# Patient Record
Sex: Female | Born: 1985 | Race: White | Hispanic: No | Marital: Married | Smoking: Current every day smoker
Health system: Southern US, Community
[De-identification: ages and names within clinical notes are randomized; demographics above are authoritative.]

## PROBLEM LIST (undated history)

## (undated) DIAGNOSIS — C73 Malignant neoplasm of thyroid gland: Secondary | ICD-10-CM

## (undated) DIAGNOSIS — J45909 Unspecified asthma, uncomplicated: Secondary | ICD-10-CM

## (undated) DIAGNOSIS — F419 Anxiety disorder, unspecified: Secondary | ICD-10-CM

## (undated) DIAGNOSIS — F32A Depression, unspecified: Secondary | ICD-10-CM

## (undated) HISTORY — DX: Anxiety disorder, unspecified: F41.9

## (undated) HISTORY — PX: THYROIDECTOMY: SHX17

## (undated) HISTORY — DX: Unspecified asthma, uncomplicated: J45.909

## (undated) HISTORY — DX: Depression, unspecified: F32.A

## (undated) HISTORY — PX: LAPAROSCOPIC HYSTERECTOMY: SHX1926

## (undated) HISTORY — DX: Malignant neoplasm of thyroid gland: C73

---

## 2003-01-16 ENCOUNTER — Emergency Department (HOSPITAL_COMMUNITY): Admission: EM | Admit: 2003-01-16 | Discharge: 2003-01-16 | Payer: Self-pay | Admitting: Emergency Medicine

## 2003-01-16 ENCOUNTER — Encounter: Payer: Self-pay | Admitting: Emergency Medicine

## 2003-05-18 ENCOUNTER — Emergency Department (HOSPITAL_COMMUNITY): Admission: EM | Admit: 2003-05-18 | Discharge: 2003-05-18 | Payer: Self-pay | Admitting: Emergency Medicine

## 2004-02-11 ENCOUNTER — Emergency Department (HOSPITAL_COMMUNITY): Admission: EM | Admit: 2004-02-11 | Discharge: 2004-02-12 | Payer: Self-pay | Admitting: Emergency Medicine

## 2004-04-11 ENCOUNTER — Emergency Department (HOSPITAL_COMMUNITY): Admission: EM | Admit: 2004-04-11 | Discharge: 2004-04-11 | Payer: Self-pay | Admitting: Emergency Medicine

## 2004-04-24 ENCOUNTER — Emergency Department (HOSPITAL_COMMUNITY): Admission: EM | Admit: 2004-04-24 | Discharge: 2004-04-24 | Payer: Self-pay | Admitting: Emergency Medicine

## 2005-06-30 IMAGING — CT CT PELVIS W/O CM
1 of 3 series · 15 of 32 positions shown, 19 images · non-contrast
Comparison: none

CLINICAL DATA: Acute left flank pain. 
 CT ABDOMEN WITHOUT CONTRAST AND CT PELVIS WITHOUT CONTRAST 
 No prior studies for comparison.  No IV or oral contrast was administered. 
 CT ABDOMEN WITHOUT CONTRAST
 Kidneys are normal in appearance without evidence of calculi or obstruction.  The other unenhanced organs are unremarkable.  There is a small accessory spleen located near the hilum of the spleen.  No bowel obstruction. 
 IMPRESSION
 No evidence of renal calculi or obstruction. 
 CT PELVIS WITHOUT CONTRAST 
 There is some air in the nondependent bladder.  No abnormal calcifications in the bladder or distal ureters.  Within the uterus some lower density material is present measuring 3.7 x 3.4 cm.  It is unclear what this represents.  Some radiopaque pill fragments are present in the small bowel.  No free fluid. 
 Air in the bladder.  Low density collection in the uterus.

[Series 4: — · axial · 0.58mm/px · z∈[+867,+1155]mm · 15 of 83 slices shown, 19 images]
[im 7/83  soft-tissue]
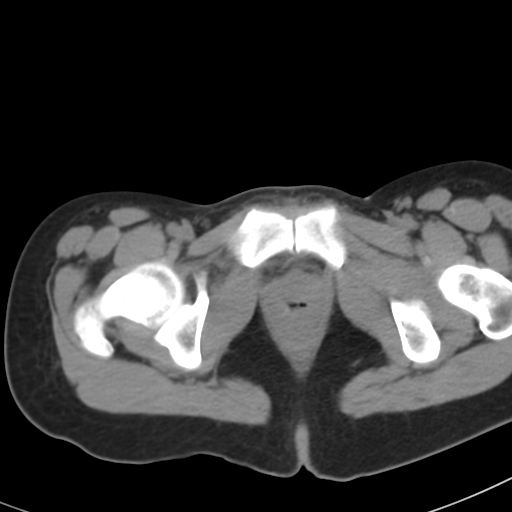
[im 7/83  bone]
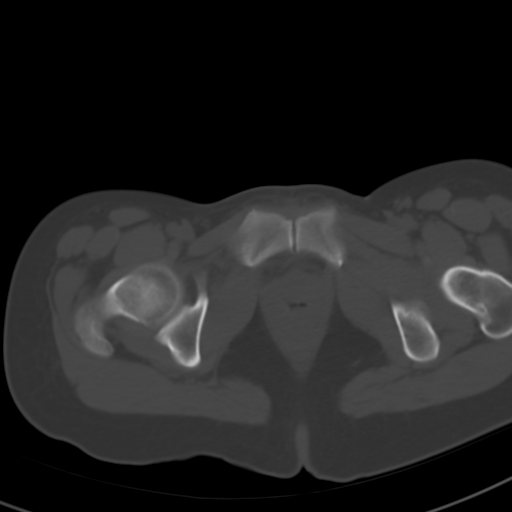
[im 13/83  soft-tissue]
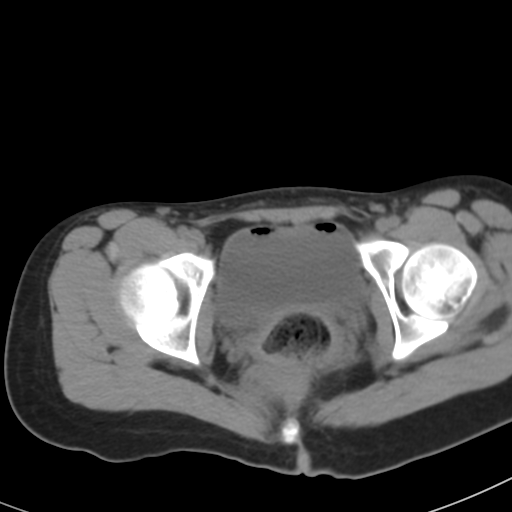
[im 19/83  soft-tissue]
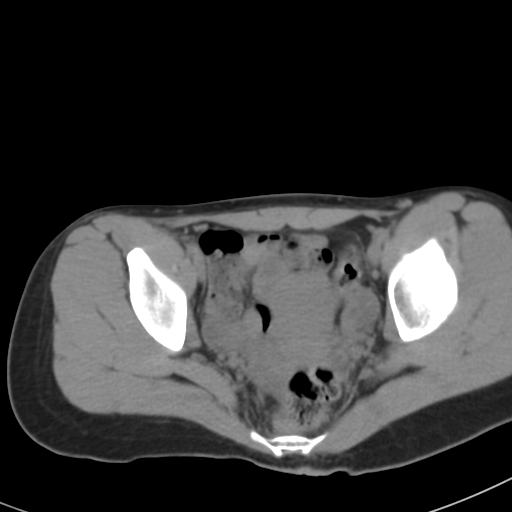
[im 25/83  soft-tissue]
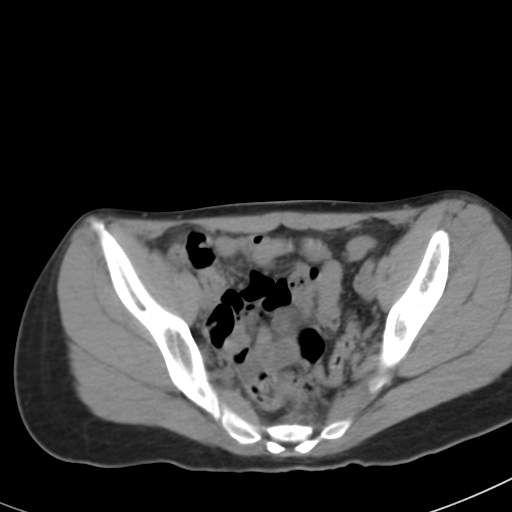
[im 31/83  soft-tissue]
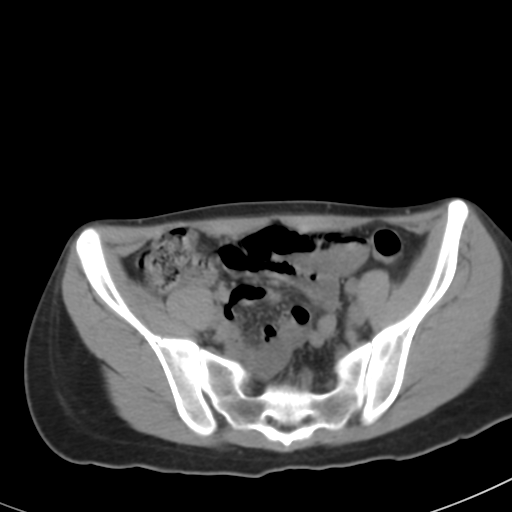
[im 37/83  soft-tissue]
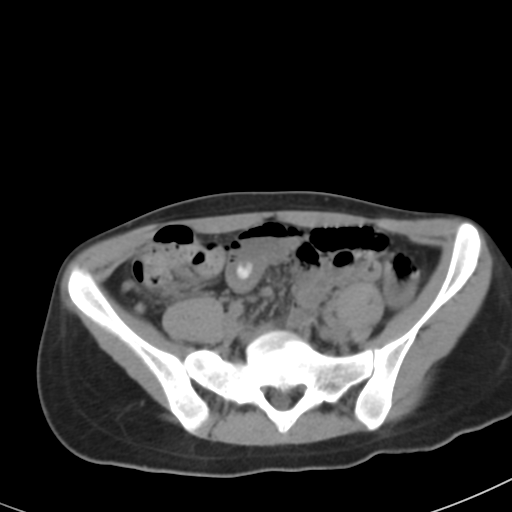
[im 43/83  soft-tissue]
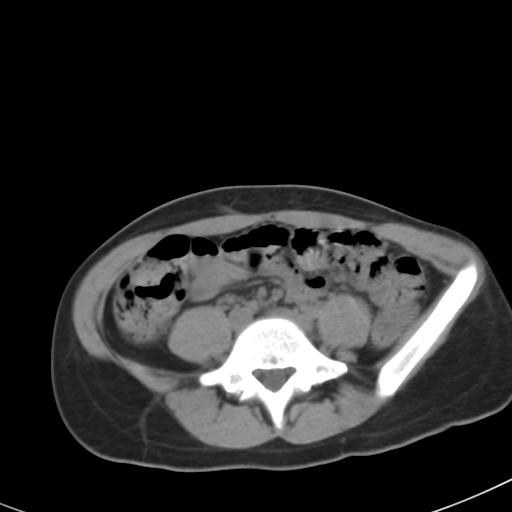
[im 49/83  soft-tissue]
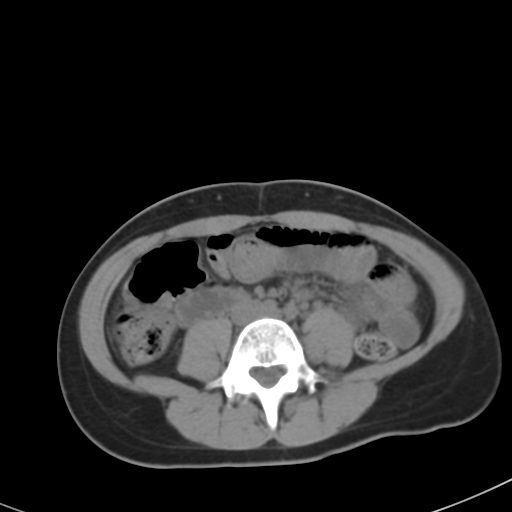
[im 55/83  soft-tissue]
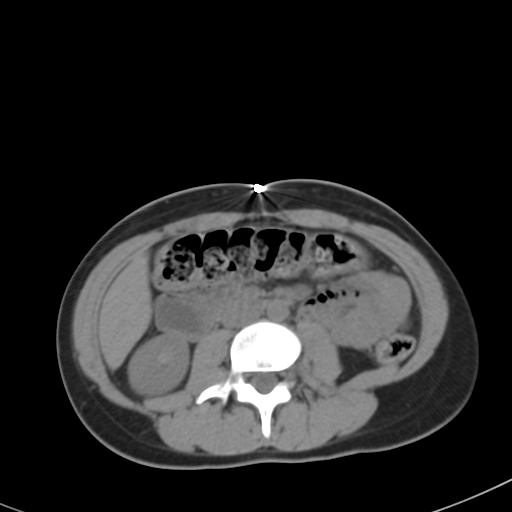
[im 55/83  bone]
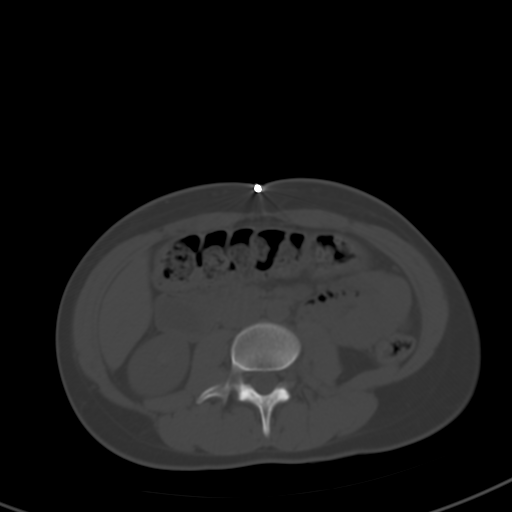
[im 61/83  soft-tissue]
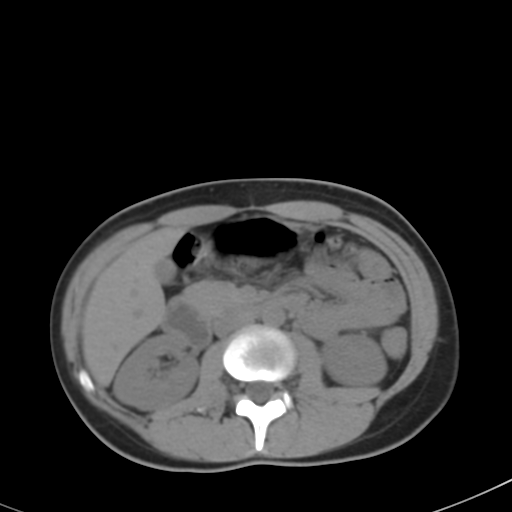
[im 67/83  soft-tissue]
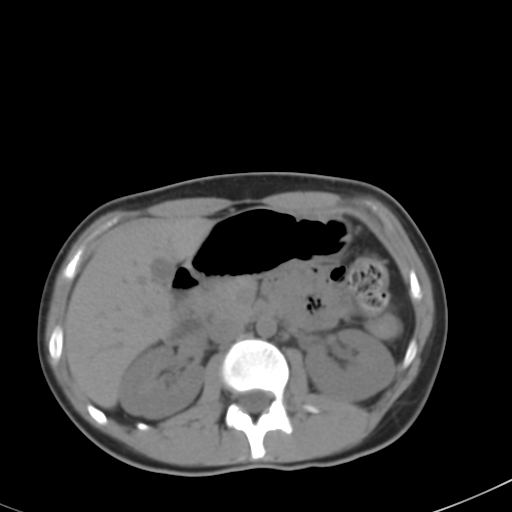
[im 70/83  lung]
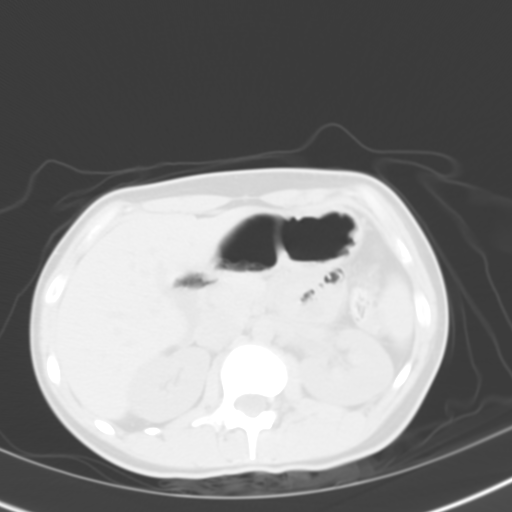
[im 73/83  soft-tissue]
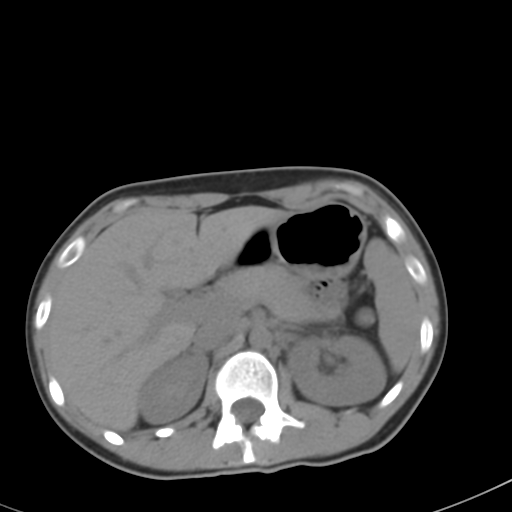
[im 73/83  lung]
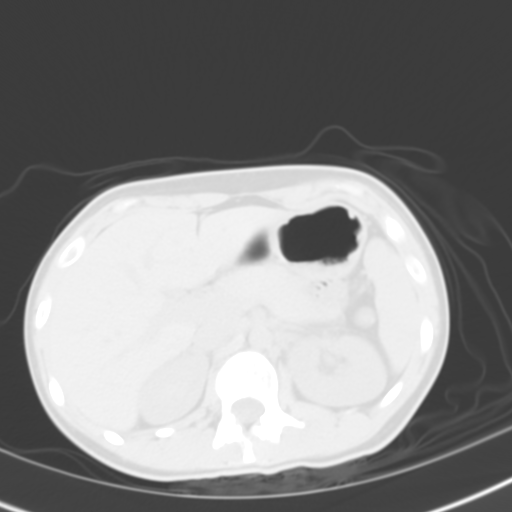
[im 76/83  lung]
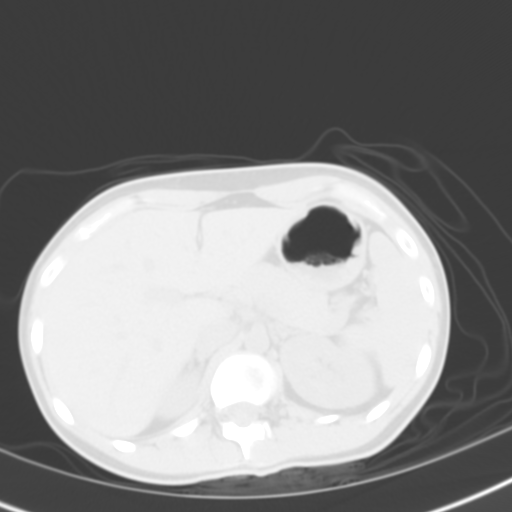
[im 79/83  soft-tissue]
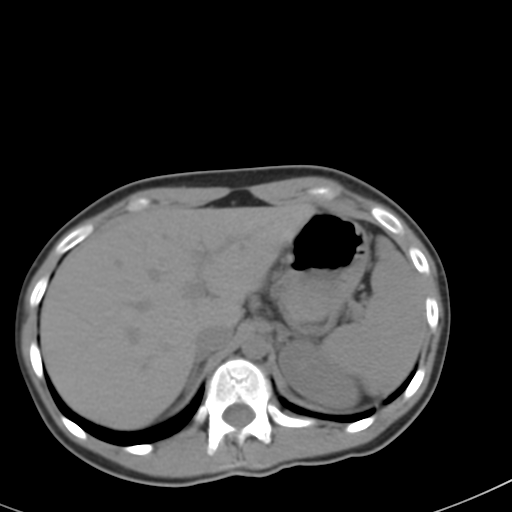
[im 79/83  lung]
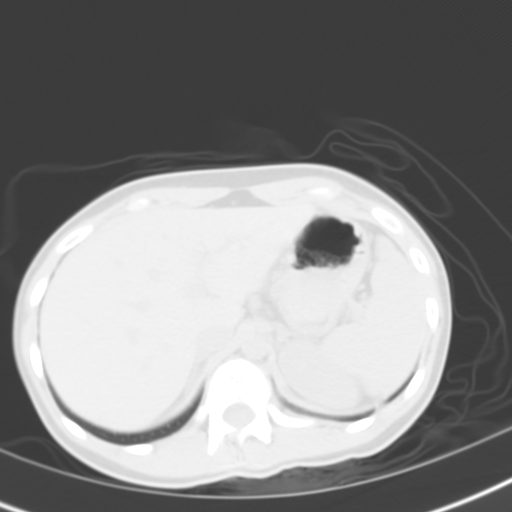

[15 of 32 positions shown; findings below may reference images not displayed]

## 2005-09-10 IMAGING — CT CT PELVIS W/O CM
1 series · 16 of 32 positions shown, 20 images · non-contrast
Comparison: none

CLINICAL DATA: Bilateral flank pain.  Dysuria.  
 CT SCAN OF THE ABDOMEN AND PELVIS WITHOUT CONTRAST, 04/24/04
 Scans were performed using the renal stone protocol.  
 CT ABDOMEN 
 The visualized portions of the liver and spleen appear normal.  Adrenal glands, pancreas, and kidneys appear normal.  There is a small splenule which is an anatomic variant.  No dilated loops of large or small bowel.  No adenopathy. 
 IMPRESSION
 Negative CT scan of the abdomen. 
 CT PELVIS
 The retrocecal appendix is well visualized and appears normal.  No dilated loops of large or small bowel.  The uterus and ovaries are not enlarged and there is no free fluid.  No distal ureteral dilatation or renal or ureteral calculi. 
 Negative CT scan of the pelvis.

[Series 3: — · axial · 0.55mm/px · z∈[-526,-162]mm · 16 of 102 slices shown, 20 images]
[im 7/102  soft-tissue]
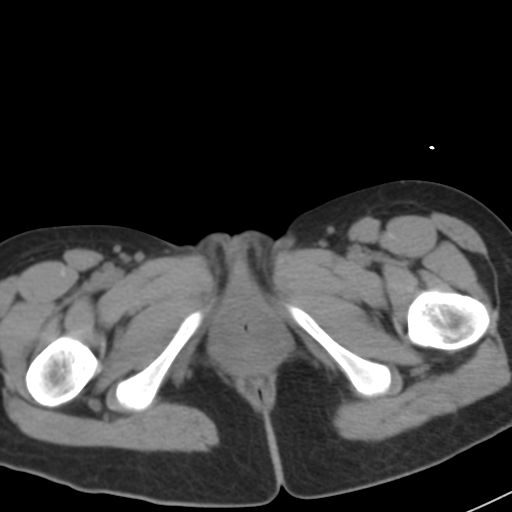
[im 7/102  bone]
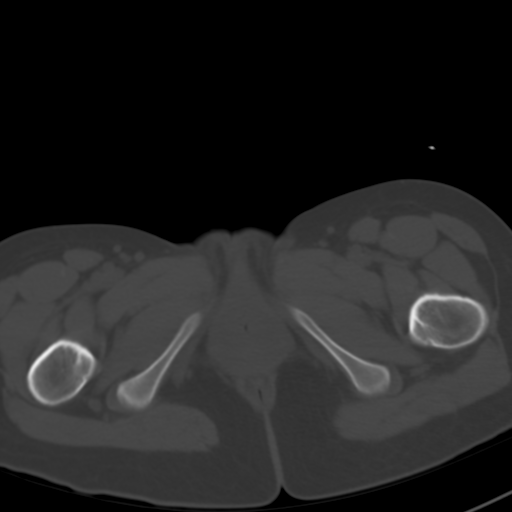
[im 14/102  soft-tissue]
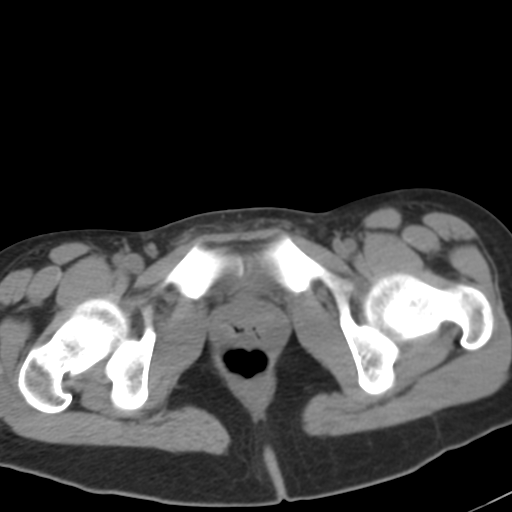
[im 20/102  soft-tissue]
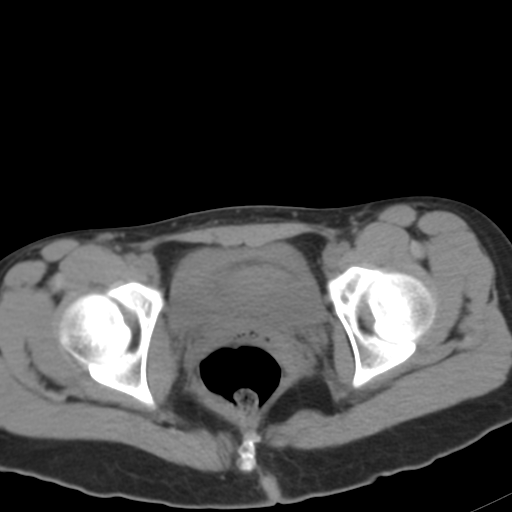
[im 27/102  soft-tissue]
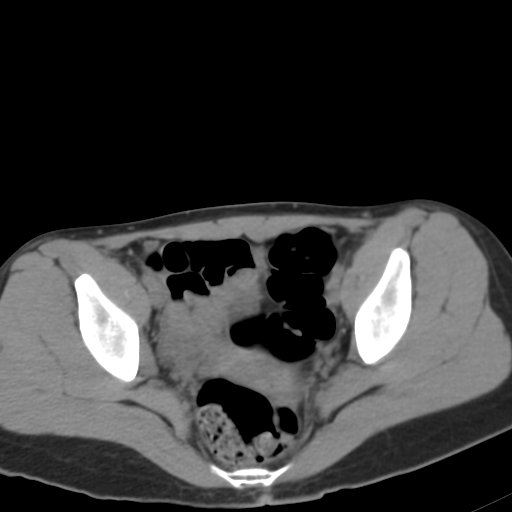
[im 33/102  soft-tissue]
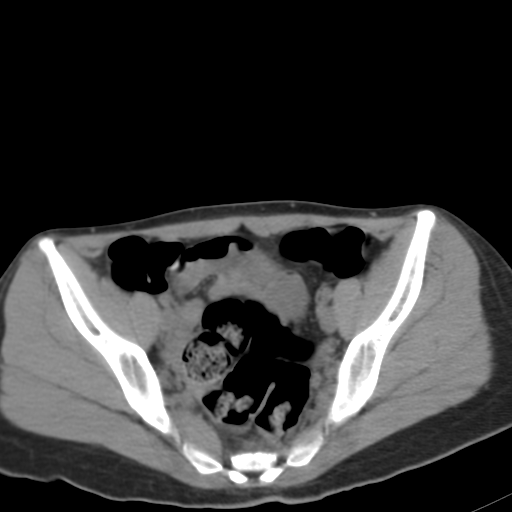
[im 40/102  soft-tissue]
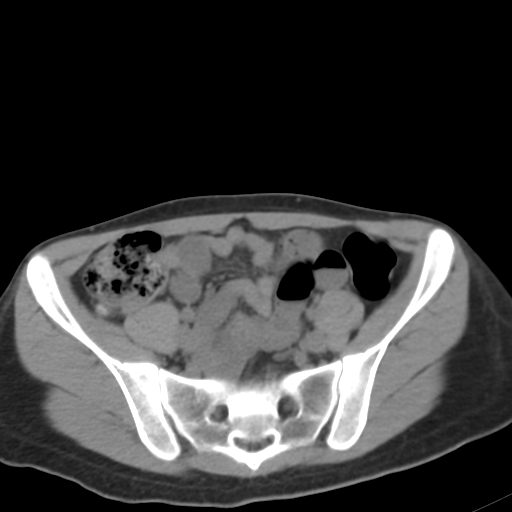
[im 46/102  soft-tissue]
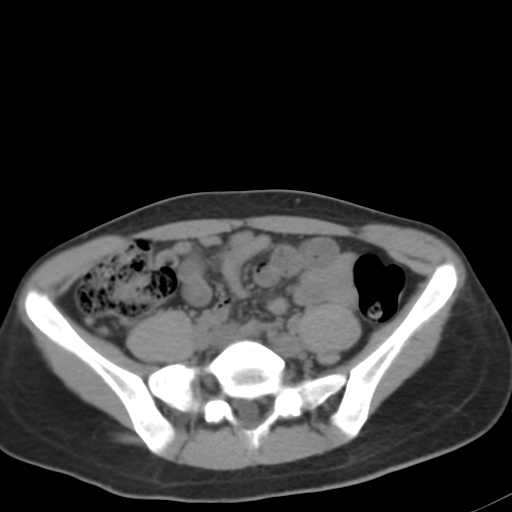
[im 56/102  soft-tissue]
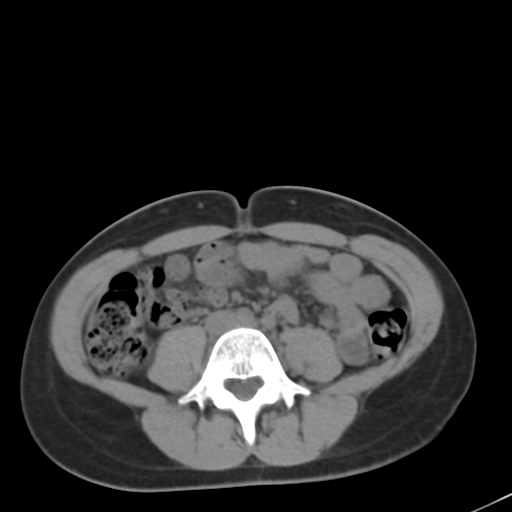
[im 62/102  soft-tissue]
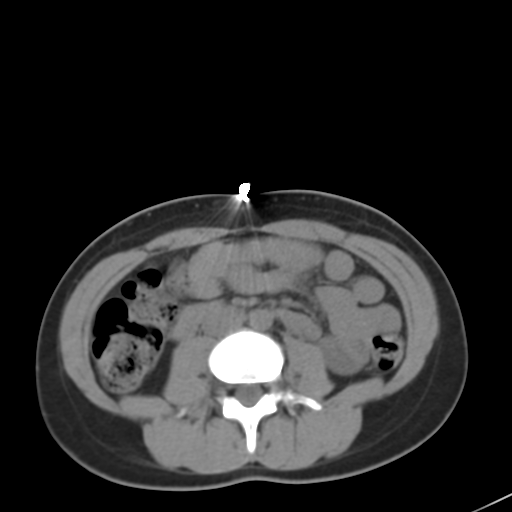
[im 62/102  bone]
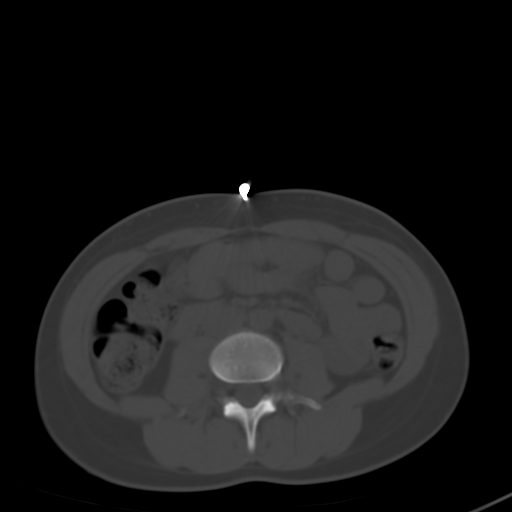
[im 69/102  soft-tissue]
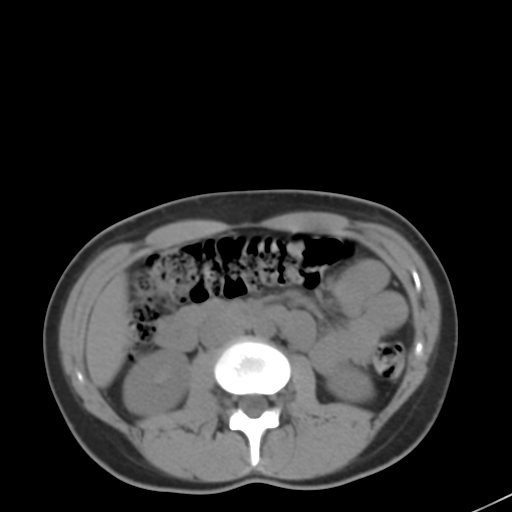
[im 75/102  soft-tissue]
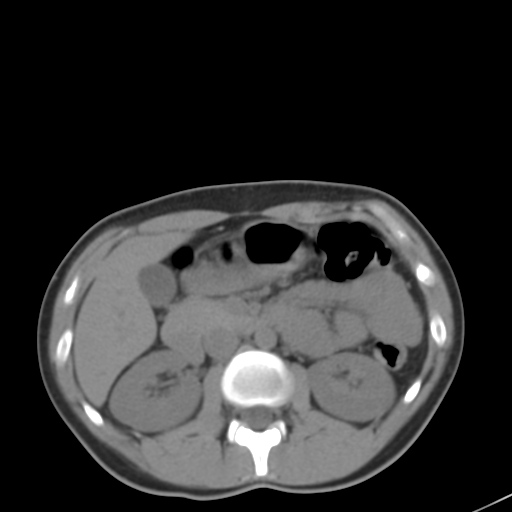
[im 82/102  soft-tissue]
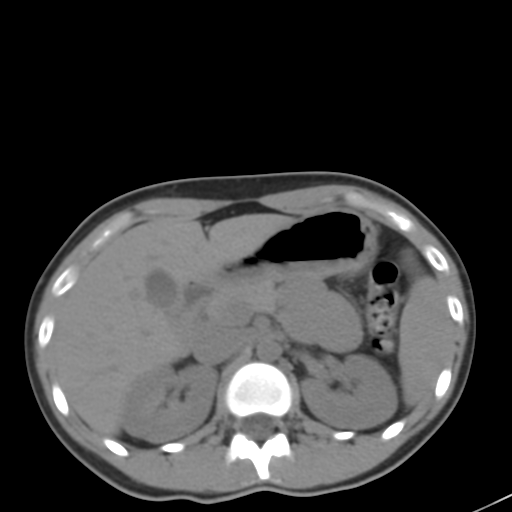
[im 88/102  soft-tissue]
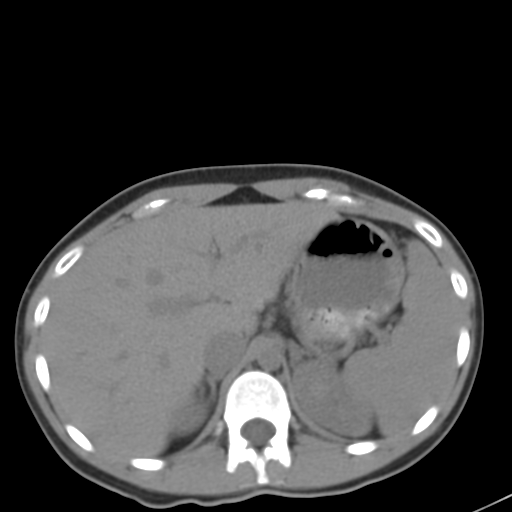
[im 88/102  lung]
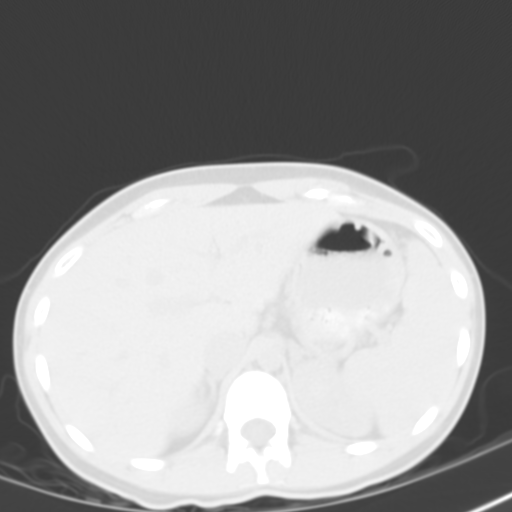
[im 92/102  lung]
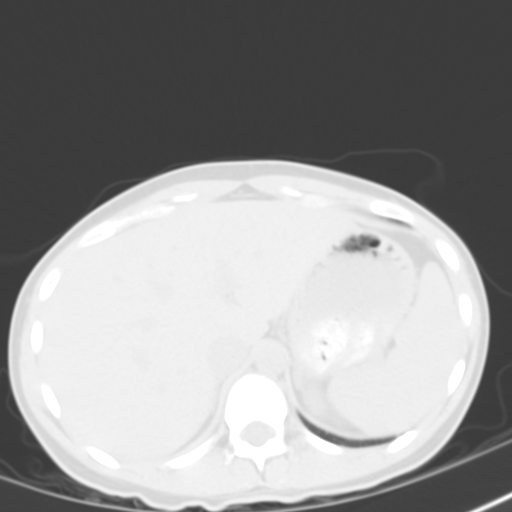
[im 95/102  soft-tissue]
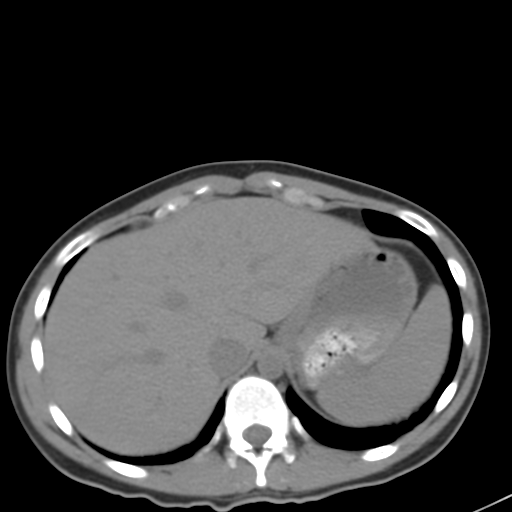
[im 95/102  lung]
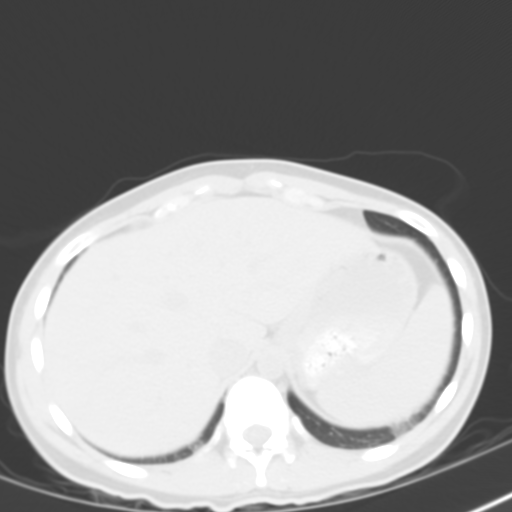
[im 98/102  lung]
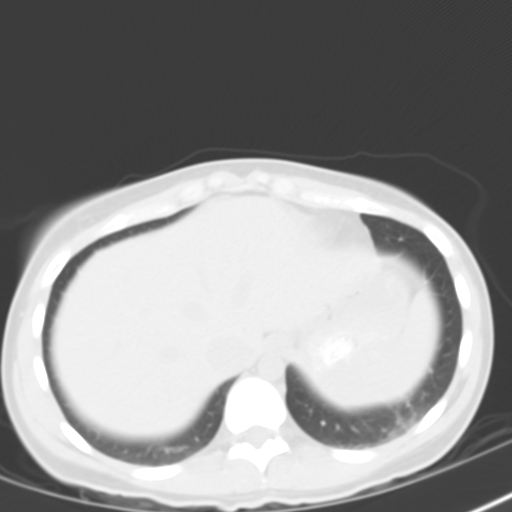

[16 of 32 positions shown; findings below may reference images not displayed]

## 2010-04-10 ENCOUNTER — Ambulatory Visit (HOSPITAL_COMMUNITY): Admission: RE | Admit: 2010-04-10 | Discharge: 2010-04-11 | Payer: Self-pay | Admitting: Obstetrics and Gynecology

## 2010-04-10 ENCOUNTER — Encounter (INDEPENDENT_AMBULATORY_CARE_PROVIDER_SITE_OTHER): Payer: Self-pay | Admitting: Obstetrics and Gynecology

## 2010-11-01 LAB — COMPREHENSIVE METABOLIC PANEL WITH GFR
ALT: 8 U/L (ref 0–35)
AST: 14 U/L (ref 0–37)
Albumin: 4.5 g/dL (ref 3.5–5.2)
Alkaline Phosphatase: 65 U/L (ref 39–117)
BUN: 18 mg/dL (ref 6–23)
CO2: 24 meq/L (ref 19–32)
Calcium: 9.5 mg/dL (ref 8.4–10.5)
Chloride: 104 meq/L (ref 96–112)
Creatinine, Ser: 0.68 mg/dL (ref 0.4–1.2)
GFR calc non Af Amer: >60 mL/min
Glucose, Bld: 110 mg/dL — ABNORMAL HIGH (ref 70–99)
Potassium: 3.3 meq/L — ABNORMAL LOW (ref 3.5–5.1)
Sodium: 137 meq/L (ref 135–145)
Total Bilirubin: 0.5 mg/dL (ref 0.3–1.2)
Total Protein: 7.5 g/dL (ref 6.0–8.3)

## 2010-11-01 LAB — CBC
HCT: 32.3 % — ABNORMAL LOW (ref 36.0–46.0)
HCT: 40.9 % (ref 36.0–46.0)
Hemoglobin: 11.1 g/dL — ABNORMAL LOW (ref 12.0–15.0)
MCH: 31.4 pg (ref 26.0–34.0)
MCHC: 34.2 g/dL (ref 30.0–36.0)
MCHC: 34.6 g/dL (ref 30.0–36.0)
MCV: 89.7 fL (ref 78.0–100.0)
MCV: 91.8 fL (ref 78.0–100.0)
Platelets: 237 10*3/uL (ref 150–400)
RBC: 3.52 MIL/uL — ABNORMAL LOW (ref 3.87–5.11)
RDW: 12.7 % (ref 11.5–15.5)
RDW: 12.8 % (ref 11.5–15.5)
WBC: 14.7 10*3/uL — ABNORMAL HIGH (ref 4.0–10.5)
WBC: 17.9 10*3/uL — ABNORMAL HIGH (ref 4.0–10.5)

## 2010-11-01 LAB — SURGICAL PCR SCREEN
MRSA, PCR: NEGATIVE
Staphylococcus aureus: POSITIVE — AB

## 2010-11-01 LAB — HEMOGLOBIN AND HEMATOCRIT, BLOOD
HCT: 34.4 % — ABNORMAL LOW (ref 36.0–46.0)
Hemoglobin: 12.2 g/dL (ref 12.0–15.0)

## 2010-11-01 LAB — APTT: aPTT: 33 seconds (ref 24–37)

## 2010-11-01 LAB — URINALYSIS, ROUTINE W REFLEX MICROSCOPIC
Bilirubin Urine: NEGATIVE
Glucose, UA: NEGATIVE mg/dL
Specific Gravity, Urine: 1.025 (ref 1.005–1.030)
pH: 6 (ref 5.0–8.0)

## 2010-11-01 LAB — URINE MICROSCOPIC-ADD ON

## 2013-05-17 ENCOUNTER — Other Ambulatory Visit (HOSPITAL_COMMUNITY): Payer: Self-pay | Admitting: Internal Medicine

## 2013-05-17 DIAGNOSIS — C73 Malignant neoplasm of thyroid gland: Secondary | ICD-10-CM

## 2013-05-25 ENCOUNTER — Encounter (HOSPITAL_COMMUNITY)
Admission: RE | Admit: 2013-05-25 | Discharge: 2013-05-25 | Disposition: A | Discharge status: Home / Self Care | Payer: Medicaid Other | Source: Ambulatory Visit | Attending: Internal Medicine | Admitting: Internal Medicine

## 2013-05-25 DIAGNOSIS — C73 Malignant neoplasm of thyroid gland: Secondary | ICD-10-CM | POA: Insufficient documentation

## 2013-05-25 LAB — HCG, SERUM, QUALITATIVE
Preg, Serum: POSITIVE — AB
Preg, Serum: POSITIVE — AB

## 2013-07-26 ENCOUNTER — Other Ambulatory Visit (HOSPITAL_COMMUNITY): Payer: Self-pay | Admitting: Internal Medicine

## 2013-07-26 DIAGNOSIS — C73 Malignant neoplasm of thyroid gland: Secondary | ICD-10-CM

## 2013-08-01 ENCOUNTER — Encounter (HOSPITAL_COMMUNITY)
Admission: RE | Admit: 2013-08-01 | Discharge: 2013-08-01 | Disposition: A | Discharge status: Home / Self Care | Payer: Medicaid Other | Source: Ambulatory Visit | Attending: Internal Medicine | Admitting: Internal Medicine

## 2013-08-01 DIAGNOSIS — C73 Malignant neoplasm of thyroid gland: Secondary | ICD-10-CM | POA: Insufficient documentation

## 2013-08-01 MED ORDER — THYROTROPIN ALFA 1.1 MG IM SOLR
0.9000 mg | INTRAMUSCULAR | Status: AC
  Administered 2013-08-01: 0.9 mg via INTRAMUSCULAR

## 2013-08-02 ENCOUNTER — Encounter (HOSPITAL_COMMUNITY)
Admission: RE | Admit: 2013-08-02 | Discharge: 2013-08-02 | Disposition: A | Discharge status: Home / Self Care | Payer: Medicaid Other | Source: Ambulatory Visit | Attending: Internal Medicine | Admitting: Internal Medicine

## 2013-08-02 DIAGNOSIS — C73 Malignant neoplasm of thyroid gland: Secondary | ICD-10-CM | POA: Insufficient documentation

## 2013-08-02 MED ORDER — THYROTROPIN ALFA 1.1 MG IM SOLR
0.9000 mg | INTRAMUSCULAR | Status: AC
  Administered 2013-08-02: 0.9 mg via INTRAMUSCULAR

## 2013-08-03 ENCOUNTER — Encounter (HOSPITAL_COMMUNITY)
Admission: RE | Admit: 2013-08-03 | Discharge: 2013-08-03 | Disposition: A | Discharge status: Home / Self Care | Payer: Medicaid Other | Source: Ambulatory Visit | Attending: Internal Medicine | Admitting: Internal Medicine

## 2013-08-03 DIAGNOSIS — C73 Malignant neoplasm of thyroid gland: Secondary | ICD-10-CM | POA: Insufficient documentation

## 2013-08-03 MED ORDER — SODIUM IODIDE I 131 CAPSULE
29.6000 | Freq: Once | INTRAVENOUS | Status: AC | PRN
  Administered 2013-08-03: 29.6 via ORAL

## 2013-08-12 ENCOUNTER — Other Ambulatory Visit (HOSPITAL_COMMUNITY): Payer: Self-pay | Admitting: Internal Medicine

## 2013-08-12 ENCOUNTER — Encounter (HOSPITAL_COMMUNITY): Payer: Medicaid Other

## 2013-08-12 ENCOUNTER — Ambulatory Visit (HOSPITAL_COMMUNITY)
Admission: RE | Admit: 2013-08-12 | Discharge: 2013-08-12 | Disposition: A | Discharge status: Home / Self Care | Payer: Medicaid Other | Source: Ambulatory Visit | Attending: Internal Medicine | Admitting: Internal Medicine

## 2013-08-12 DIAGNOSIS — C73 Malignant neoplasm of thyroid gland: Secondary | ICD-10-CM

## 2013-08-12 DIAGNOSIS — Z9889 Other specified postprocedural states: Secondary | ICD-10-CM | POA: Insufficient documentation

## 2014-06-28 ENCOUNTER — Other Ambulatory Visit (HOSPITAL_COMMUNITY): Payer: Self-pay | Admitting: Internal Medicine

## 2014-06-28 DIAGNOSIS — C73 Malignant neoplasm of thyroid gland: Secondary | ICD-10-CM

## 2014-07-17 ENCOUNTER — Encounter (HOSPITAL_COMMUNITY)
Admission: RE | Admit: 2014-07-17 | Discharge: 2014-07-17 | Disposition: A | Discharge status: Home / Self Care | Payer: Medicaid Other | Source: Ambulatory Visit | Attending: Internal Medicine | Admitting: Internal Medicine

## 2014-07-17 DIAGNOSIS — C73 Malignant neoplasm of thyroid gland: Secondary | ICD-10-CM

## 2014-07-17 MED ORDER — THYROTROPIN ALFA 1.1 MG IM SOLR
0.9000 mg | INTRAMUSCULAR | Status: AC
  Administered 2014-07-17: 0.9 mg via INTRAMUSCULAR

## 2014-07-18 ENCOUNTER — Encounter (HOSPITAL_COMMUNITY)
Admission: RE | Admit: 2014-07-18 | Discharge: 2014-07-18 | Disposition: A | Discharge status: Home / Self Care | Payer: Medicaid Other | Source: Ambulatory Visit | Attending: Internal Medicine | Admitting: Internal Medicine

## 2014-07-18 DIAGNOSIS — C73 Malignant neoplasm of thyroid gland: Secondary | ICD-10-CM | POA: Diagnosis not present

## 2014-07-18 MED ORDER — THYROTROPIN ALFA 1.1 MG IM SOLR
0.9000 mg | INTRAMUSCULAR | Status: AC
  Administered 2014-07-18: 0.9 mg via INTRAMUSCULAR

## 2014-07-19 ENCOUNTER — Encounter (HOSPITAL_COMMUNITY)
Admission: RE | Admit: 2014-07-19 | Discharge: 2014-07-19 | Disposition: A | Discharge status: Home / Self Care | Payer: Medicaid Other | Source: Ambulatory Visit | Attending: Internal Medicine | Admitting: Internal Medicine

## 2014-07-19 MED ORDER — SODIUM IODIDE I 131 CAPSULE: 4.0000 | Freq: Once | INTRAVENOUS | Status: AC | PRN

## 2014-07-21 ENCOUNTER — Encounter (HOSPITAL_COMMUNITY)
Admission: RE | Admit: 2014-07-21 | Discharge: 2014-07-21 | Disposition: A | Discharge status: Home / Self Care | Payer: Medicaid Other | Source: Ambulatory Visit | Attending: Internal Medicine | Admitting: Internal Medicine

## 2014-07-21 DIAGNOSIS — C73 Malignant neoplasm of thyroid gland: Secondary | ICD-10-CM | POA: Diagnosis not present

## 2014-07-22 LAB — THYROGLOBULIN ANTIBODY

## 2014-07-22 LAB — THYROGLOBULIN LEVEL: THYROGLOBULIN: 0.1 ng/mL — AB (ref 2.8–40.9)

## 2021-03-19 ENCOUNTER — Emergency Department (HOSPITAL_COMMUNITY)
Admission: EM | Admit: 2021-03-19 | Discharge: 2021-03-19 | Disposition: A | Discharge status: Home / Self Care | Payer: Medicaid Other | Attending: Emergency Medicine | Admitting: Emergency Medicine

## 2021-03-19 ENCOUNTER — Emergency Department (HOSPITAL_COMMUNITY): Payer: Medicaid Other

## 2021-03-19 DIAGNOSIS — K529 Noninfective gastroenteritis and colitis, unspecified: Secondary | ICD-10-CM | POA: Diagnosis not present

## 2021-03-19 DIAGNOSIS — R1032 Left lower quadrant pain: Secondary | ICD-10-CM | POA: Diagnosis present

## 2021-03-19 LAB — URINALYSIS, ROUTINE W REFLEX MICROSCOPIC
Bilirubin Urine: NEGATIVE
Glucose, UA: NEGATIVE mg/dL
Ketones, ur: NEGATIVE mg/dL
Leukocytes,Ua: NEGATIVE
Nitrite: NEGATIVE
Protein, ur: NEGATIVE mg/dL
Specific Gravity, Urine: 1.021 (ref 1.005–1.030)
pH: 5 (ref 5.0–8.0)

## 2021-03-19 LAB — CBC WITH DIFFERENTIAL/PLATELET
Abs Immature Granulocytes: 0.04 10*3/uL (ref 0.00–0.07)
Basophils Absolute: 0 10*3/uL (ref 0.0–0.1)
Basophils Relative: 0 %
Eosinophils Absolute: 0.1 10*3/uL (ref 0.0–0.5)
Eosinophils Relative: 1 %
HCT: 39.2 % (ref 36.0–46.0)
Hemoglobin: 13.6 g/dL (ref 12.0–15.0)
Immature Granulocytes: 0 %
Lymphocytes Relative: 24 %
Lymphs Abs: 2.8 10*3/uL (ref 0.7–4.0)
MCH: 31.3 pg (ref 26.0–34.0)
MCHC: 34.7 g/dL (ref 30.0–36.0)
MCV: 90.3 fL (ref 80.0–100.0)
Monocytes Absolute: 0.6 10*3/uL (ref 0.1–1.0)
Monocytes Relative: 5 %
Neutro Abs: 7.9 10*3/uL — ABNORMAL HIGH (ref 1.7–7.7)
Neutrophils Relative %: 70 %
Platelets: 313 10*3/uL (ref 150–400)
RBC: 4.34 MIL/uL (ref 3.87–5.11)
RDW: 12.1 % (ref 11.5–15.5)
WBC: 11.4 10*3/uL — ABNORMAL HIGH (ref 4.0–10.5)
nRBC: 0 % (ref 0.0–0.2)

## 2021-03-19 LAB — COMPREHENSIVE METABOLIC PANEL
ALT: 16 U/L (ref 0–44)
AST: 18 U/L (ref 15–41)
Albumin: 4.3 g/dL (ref 3.5–5.0)
Alkaline Phosphatase: 72 U/L (ref 38–126)
Anion gap: 8 (ref 5–15)
BUN: 18 mg/dL (ref 6–20)
CO2: 26 mmol/L (ref 22–32)
Calcium: 9.4 mg/dL (ref 8.9–10.3)
Chloride: 105 mmol/L (ref 98–111)
Creatinine, Ser: 0.8 mg/dL (ref 0.44–1.00)
GFR, Estimated: >60 mL/min (ref >60)
Glucose, Bld: 107 mg/dL — ABNORMAL HIGH (ref 70–99)
Potassium: 3.7 mmol/L (ref 3.5–5.1)
Sodium: 139 mmol/L (ref 135–145)
Total Bilirubin: 0.5 mg/dL (ref 0.3–1.2)
Total Protein: 7 g/dL (ref 6.5–8.1)

## 2021-03-19 LAB — LACTIC ACID, PLASMA: Lactic Acid, Venous: 1.5 mmol/L (ref 0.5–1.9)

## 2021-03-19 LAB — I-STAT BETA HCG BLOOD, ED (MC, WL, AP ONLY): I-stat hCG, quantitative: <5 m[IU]/mL (ref <5)

## 2021-03-19 LAB — LIPASE, BLOOD: Lipase: 30 U/L (ref 11–51)

## 2021-03-19 MED ORDER — HYDROCODONE-ACETAMINOPHEN 5-325 MG PO TABS: 1.0000 | ORAL_TABLET | ORAL | 0 refills | Status: DC | PRN

## 2021-03-19 MED ORDER — MORPHINE SULFATE (PF) 4 MG/ML IV SOLN
4.0000 mg | Freq: Once | INTRAVENOUS | Status: AC
  Administered 2021-03-19: 4 mg via INTRAVENOUS
  Filled 2021-03-19: qty 1

## 2021-03-19 MED ORDER — METOCLOPRAMIDE HCL 10 MG PO TABS: 10.0000 mg | ORAL_TABLET | Freq: Four times a day (QID) | ORAL | 0 refills | Status: DC

## 2021-03-19 MED ORDER — SODIUM CHLORIDE 0.9 % IV BOLUS
1000.0000 mL | Freq: Once | INTRAVENOUS | Status: AC
  Administered 2021-03-19: 1000 mL via INTRAVENOUS

## 2021-03-19 MED ORDER — ONDANSETRON HCL 4 MG/2ML IJ SOLN
4.0000 mg | Freq: Once | INTRAMUSCULAR | Status: AC
  Administered 2021-03-19: 4 mg via INTRAVENOUS
  Filled 2021-03-19: qty 2

## 2021-03-19 MED ORDER — IOHEXOL 300 MG/ML  SOLN
100.0000 mL | Freq: Once | INTRAMUSCULAR | Status: AC | PRN
  Administered 2021-03-19: 100 mL via INTRAVENOUS

## 2021-03-19 MED ORDER — METOCLOPRAMIDE HCL 5 MG/ML IJ SOLN
10.0000 mg | Freq: Once | INTRAMUSCULAR | Status: AC
  Administered 2021-03-19: 10 mg via INTRAVENOUS
  Filled 2021-03-19: qty 2

## 2021-03-19 NOTE — Discharge Instructions (Addendum)
Follow-up with GI, call in the morning to schedule an appointment. Take pain medication as needed as prescribed.  Take Reglan as needed as prescribed for nausea and vomiting. Return to the ER for worsening or concerning symptoms.

## 2021-03-19 NOTE — ED Notes (Signed)
Patient returned from CT

## 2021-03-19 NOTE — ED Notes (Signed)
ED Provider at bedside. 

## 2021-03-19 NOTE — ED Provider Notes (Signed)
Emergency Medicine Provider Triage Evaluation Note  Janice Andersen , a 35 y.o. female  was evaluated in triage.  Pt complains of llq abd pain and bloody stools. Hx diverticulosis.  Review of Systems  Positive: Llq abd ttp, bloody stools Negative: fever  Physical Exam  BP (!) 132/93 (BP Location: Right Arm)   Pulse 70   Temp 98.4 F (36.9 C) (Oral)   Resp 14   SpO2 97%  Gen:   Awake, no distress   Resp:  Normal effort  MSK:   Moves extremities without difficulty  Other:  Abd soft  Medical Decision Making  Medically screening exam initiated at 12:55 PM.  Appropriate orders placed.  Janice Andersen was informed that the remainder of the evaluation will be completed by another provider, this initial triage assessment does not replace that evaluation, and the importance of remaining in the ED until their evaluation is complete.     Bishop Dublin 03/19/21 1255    Wyvonnia Dusky, MD 03/19/21 [phone]

## 2021-03-19 NOTE — ED Triage Notes (Signed)
Pt here from home with llq pain and 4 bloody stools since Monday morning

## 2021-03-19 NOTE — ED Provider Notes (Signed)
[facility] EMERGENCY DEPARTMENT Provider Note   CSN: [id number] Arrival date & time: 03/19/21  1211     History Chief Complaint  Patient presents with   Abdominal Pain   Hematochezia    Janice Andersen is a 35 y.o. female.  35 year old female with history of diverticulitis presents with complaint of left lower quadrant abdominal pain onset yesterday morning around 4 AM with 4 episodes of bloody stools (loose with mucous and bright red blood in the commode and on the toilet paper, today had more formed stool).  Pain radiates into her back, is worse with bowel movements.  Denies fevers, chills.  Does report one episode of nausea and vomiting.  Prior abdominal surgery includes hysterectomy. No other complaints or concerns. No history of IVDA, to history of afib/arrhythmia.      OB History   No obstetric history on file.     No family history on file.     Home Medications Prior to Admission medications   Medication Sig Start Date End Date Taking? Authorizing Provider  HYDROcodone-acetaminophen (NORCO/VICODIN) 5-325 MG tablet Take 1 tablet by mouth every 4 (four) hours as needed. 03/19/21  Yes Tacy Learn, PA-C  metoCLOPramide (REGLAN) 10 MG tablet Take 1 tablet (10 mg total) by mouth every 6 (six) hours. 03/19/21  Yes Tacy Learn, PA-C    Allergies    Phenergan [promethazine hcl]  Review of Systems   Review of Systems  Constitutional:  Negative for chills and fever.  Respiratory:  Negative for shortness of breath.   Cardiovascular:  Negative for chest pain.  Gastrointestinal:  Positive for abdominal pain, blood in stool, diarrhea, nausea and vomiting. Negative for constipation.  Genitourinary:  Negative for dysuria and frequency.  Musculoskeletal:  Positive for back pain. Negative for arthralgias and myalgias.  Skin:  Negative for rash and wound.  Allergic/Immunologic: Negative for immunocompromised state.  Neurological:  Negative for weakness.   Hematological:  Does not bruise/bleed easily.  Psychiatric/Behavioral:  Negative for confusion.    Physical Exam Updated Vital Signs BP 118/86 (BP Location: Right Arm)   Pulse 74   Temp 97.8 F (36.6 C) (Oral)   Resp 20   SpO2 99%   Physical Exam Vitals and nursing note reviewed.  Constitutional:      General: She is not in acute distress.    Appearance: She is well-developed. She is not diaphoretic.  HENT:     Head: Normocephalic and atraumatic.  Cardiovascular:     Rate and Rhythm: Normal rate and regular rhythm.     Heart sounds: Normal heart sounds.  Pulmonary:     Effort: Pulmonary effort is normal.     Breath sounds: Normal breath sounds.  Abdominal:     Palpations: Abdomen is soft.     Tenderness: There is abdominal tenderness in the left lower quadrant. There is no right CVA tenderness or left CVA tenderness.  Skin:    General: Skin is warm and dry.     Findings: No erythema or rash.  Neurological:     Mental Status: She is alert and oriented to person, place, and time.  Psychiatric:        Behavior: Behavior normal.    ED Results / Procedures / Treatments   Labs (all labs ordered are listed, but only abnormal results are displayed) Labs Reviewed  CBC WITH DIFFERENTIAL/PLATELET - Abnormal; Notable for the following components:      Result Value   WBC 11.4 (*)  Neutro Abs 7.9 (*)    All other components within normal limits  COMPREHENSIVE METABOLIC PANEL - Abnormal; Notable for the following components:   Glucose, Bld 107 (*)    All other components within normal limits  URINALYSIS, ROUTINE W REFLEX MICROSCOPIC - Abnormal; Notable for the following components:   APPearance HAZY (*)    Hgb urine dipstick SMALL (*)    Bacteria, UA RARE (*)    All other components within normal limits  LIPASE, BLOOD  LACTIC ACID, PLASMA  I-STAT BETA HCG BLOOD, ED ([facility], [facility], [facility] ONLY)    EKG None  Radiology CT Abdomen Pelvis W Contrast  Result Date:  03/19/2021 CLINICAL DATA:  Left lower abdominal pain with hematochezia. Diverticulitis suspected. EXAM: CT ABDOMEN AND PELVIS WITH CONTRAST TECHNIQUE: Multidetector CT imaging of the abdomen and pelvis was performed using the standard protocol following bolus administration of intravenous contrast. CONTRAST:  177m OMNIPAQUE IOHEXOL 300 MG/ML  SOLN COMPARISON:  09[place] Lower chest: Clear lung bases. Normal heart size without pericardial or pleural effusion. Hepatobiliary: Normal liver. Normal gallbladder, without biliary ductal dilatation. Pancreas: Normal, without mass or ductal dilatation. Spleen: Normal in size, without focal abnormality. Adrenals/Urinary Tract: Normal adrenal glands. Normal kidneys, without hydronephrosis. Normal urinary bladder. Stomach/Bowel: Normal stomach, without wall thickening. Subtle wall thickening of and edema adjacent the descending colon, including on images 46 and 54 of series 3. No significant diverticula in this area. Normal terminal ileum and appendix.  Normal small bowel. Vascular/Lymphatic: Patent mesenteric vessels. Normal abdominal aortic caliber. No abdominopelvic adenopathy. Reproductive: Hysterectomy.  No adnexal mass. Other: No significant free fluid.  No free intraperitoneal air. Musculoskeletal: No acute osseous abnormality. IMPRESSION: Subtle descending colitis which, given the distribution and clinical history, is suspicious for ischemia. Electronically Signed   By: KAbigail MiyamotoM.D.   On: 03/19/2021 18:00    Procedures Procedures   Medications Ordered in ED Medications  sodium chloride 0.9 % bolus 1,000 mL (0 mLs Intravenous Stopped 03/19/21 1724)  ondansetron (ZOFRAN) injection 4 mg (4 mg Intravenous Given 03/19/21 1614)  morphine 4 MG/ML injection 4 mg (4 mg Intravenous Given 03/19/21 1616)  ondansetron (ZOFRAN) injection 4 mg (4 mg Intravenous Given 03/19/21 1736)  iohexol (OMNIPAQUE) 300 MG/ML solution 100 mL (100 mLs  Intravenous Contrast Given 03/19/21 1733)  metoCLOPramide (REGLAN) injection 10 mg (10 mg Intravenous Given 03/19/21 1923)    ED Course  I have reviewed the triage vital signs and the nursing notes.  Pertinent labs & imaging results that were available during my care of the patient were reviewed by me and considered in my medical decision making (see chart for details).  Clinical Course as of 03/19/21 2042  Tue Aug 02, [address]011 [id number] old female presents with complaint of left lower quadrant abdominal pain onset 4 AM yesterday radiates into her back with loose stools with mucus and bright red blood.  Also with nausea and vomiting, no fevers.  On exam found to have mild left lower quadrant tenderness. Labs returned with mild leukocytosis with white count of 11.4. CT returns with concern for subtle descending colitis which is suspicious for ischemia. Case discussed with Dr. SThermon Leyland on call with general surgery who has reviewed the case and does not feel this is anything surgical, may follow-up outpatient with GI. [LM]  1859 Patient was given Reglan with improvement in her symptoms, tolerating oral fluids and feeling much better at this time.  Plan is to discharge with Reglan and Norco  with follow-up with GI, advised to call tomorrow to schedule appointment.  Recommend return to the ED for worsening or concerning symptoms. [LM]    Clinical Course User Index [LM] Roque Lias   MDM Rules/Calculators/A&P                           Final Clinical Impression(s) / ED Diagnoses Final diagnoses:  Left lower quadrant abdominal pain  Colitis    Rx / DC Orders ED Discharge Orders          Ordered    metoCLOPramide (REGLAN) 10 MG tablet  Every 6 hours        03/19/21 2040    HYDROcodone-acetaminophen (NORCO/VICODIN) 5-325 MG tablet  Every 4 hours PRN        03/19/21 2040             Tacy Learn, PA-C 03/19/21 2043    Lucrezia Starch, MD 03/23/21 2113

## 2021-03-20 ENCOUNTER — Encounter: Payer: Self-pay | Admitting: Gastroenterology

## 2021-03-21 ENCOUNTER — Encounter: Payer: Self-pay | Admitting: Gastroenterology

## 2021-04-29 ENCOUNTER — Ambulatory Visit: Payer: Medicaid Other | Admitting: Gastroenterology

## 2021-05-02 ENCOUNTER — Encounter: Payer: Self-pay | Admitting: Gastroenterology

## 2021-05-02 ENCOUNTER — Ambulatory Visit (INDEPENDENT_AMBULATORY_CARE_PROVIDER_SITE_OTHER): Payer: Medicaid Other | Admitting: Gastroenterology

## 2021-05-02 ENCOUNTER — Other Ambulatory Visit: Payer: Self-pay

## 2021-05-02 VITALS — BP 128/62 | HR 70 | Ht 60.0 in | Wt 129.0 lb

## 2021-05-02 DIAGNOSIS — R1032 Left lower quadrant pain: Secondary | ICD-10-CM

## 2021-05-02 DIAGNOSIS — R194 Change in bowel habit: Secondary | ICD-10-CM | POA: Diagnosis not present

## 2021-05-02 DIAGNOSIS — R935 Abnormal findings on diagnostic imaging of other abdominal regions, including retroperitoneum: Secondary | ICD-10-CM

## 2021-05-02 DIAGNOSIS — K921 Melena: Secondary | ICD-10-CM

## 2021-05-02 DIAGNOSIS — R197 Diarrhea, unspecified: Secondary | ICD-10-CM

## 2021-05-02 DIAGNOSIS — Z716 Tobacco abuse counseling: Secondary | ICD-10-CM

## 2021-05-02 MED ORDER — DICYCLOMINE HCL 10 MG PO CAPS: 10.0000 mg | ORAL_CAPSULE | Freq: Four times a day (QID) | ORAL | 3 refills | Status: DC | PRN

## 2021-05-02 MED ORDER — DICYCLOMINE HCL 10 MG PO CAPS: 10.0000 mg | ORAL_CAPSULE | Freq: Four times a day (QID) | ORAL | 3 refills | Status: AC | PRN

## 2021-05-02 NOTE — Progress Notes (Signed)
Chief Complaint: LLQ pain, hematochezia, abnormal imaging study  Referring Provider:     Lucrezia Starch, MD (ER)   HPI:     Janice Andersen is a 35 y.o. female with a history of depression, anxiety, prior papillary thyroid cancer s/p thyroidectomy with postoperative hypothyroidism, hysterectomy, referred to the Gastro[facility]ology Clinic for evaluation of hematochezia, abdominal pain, and colitis on CT as below.   Patient was seen in the ER on 03/19/2021 with LLQ pain x1 day and 4 episodes of hematochezia with mucus-like BRBPR.  Evaluation notable for the following: - WBC 11.4, otherwise normal CBC - Normal CMP, lipase, lactate - CT abdomen/pelvis: Subtle wall thickening/edema adjacent to descending colon, suspicious for ischemia given distribution and clinical history.  Otherwise normal GI tract, liver, pancreas, GB - Treated with IVF, antiemetics; discharged with Reglan, Norco with GI referral  States that was her first episode of those sxs; no prior similar sxs. Still having LLQ pain, described as spasm. Spasm/cramp improves with BM. +Urgency. Hematochezia has resolved, but still with mucus-like, soft stools.   Does report a hx of diverticulitis diagnosed 3-4 years ago at [facility] (LLQ pain, w/o hematochezia or diarrhea). Treated with Abx.  No records or imaging available for review but she thinks she had a CT done.  No previous EGD or colonoscopy.  No labs or imaging since ER evaluation on 03/19/2021.  MGM with mets Ovarian Cancer. No known family history of CRC, liver disease, pancreatic disease, or IBD.   Past Medical History:  Diagnosis Date   Anxiety    Asthma    Depression    Thyroid cancer ([place])      Past Surgical History:  Procedure Laterality Date   LAPAROSCOPIC HYSTERECTOMY     THYROIDECTOMY     Family History  Problem Relation Age of Onset   Colon cancer Neg Hx    Social History   Tobacco Use   Smoking status: Every Day    Types:  Cigarettes  Vaping Use   Vaping Use: Never used  Substance Use Topics   Alcohol use: Yes   Drug use: Never   Current Outpatient Medications  Medication Sig Dispense Refill   levothyroxine (SYNTHROID) 88 MCG tablet Take 88 mcg by mouth daily before breakfast.     No current facility-administered medications for this visit.   Allergies  Allergen Reactions   Phenergan [Promethazine Hcl] Swelling     Review of Systems: All systems reviewed and negative except where noted in HPI.     Physical Exam:    Wt Readings from Last 3 Encounters:  05/02/21 129 lb (58.5 kg)    BP 128/62   Pulse 70   Ht 5' (1.524 m)   Wt 129 lb (58.5 kg)   BMI 25.19 kg/m  Constitutional:  Pleasant, in no acute distress. Psychiatric: Normal mood and affect. Behavior is normal. Cardiovascular: Normal rate, regular rhythm. No edema Pulmonary/chest: Effort normal and breath sounds normal. No wheezing, rales or rhonchi. Abdominal: Mild TTP in LLQ without rebound or guarding.  No peritoneal signs.  Soft, nondistended, nontender. Bowel sounds active throughout. There are no masses palpable. No hepatomegaly. Neurological: Alert and oriented to person place and time. Skin: Skin is warm and dry. No rashes noted.   ASSESSMENT AND PLAN;   1) LLQ pain 2) Hematochezia 3) Change in bowel habits 4) Diarrhea/Mucus-like stools 5) Abnormal imaging study 6) Leukocytosis  Discussed DDx for presenting  symptoms, to include ischemic colitis (tobacco+, drinks 1 Monster daily; no other exposures or risk factors identified today), Ulcerative Colitis, and less likely diverticulitis based on lack of diverticular disease on imaging.  Plan to proceed as below: - Symptoms are now 6+ weeks ago.  Ok to proceed with colonoscopy to evaluate for mucosal/luminal pathology, particular given ongoing symptoms. - Bentyl - Quit smoking - Increase daily water intake  7) Tobacco use disorder - Discussed tobacco cessation today, to  include risks associated with continued smoking from a GI standpoint in addition to cardiopulmonary risks  The indications, risks, and benefits of colonoscopy were explained to the patient in detail. Risks include but are not limited to bleeding, perforation, adverse reaction to medications, and cardiopulmonary compromise. Sequelae include but are not limited to the possibility of surgery, hospitalization, and mortality. The patient verbalized understanding and wished to proceed. All questions answered, referred to the scheduler and bowel prep ordered. Further recommendations pending results of the exam.     Janice Bullion, DO, FACG  05/02/2021, 1:14 PM   Janice Dykes, MD

## 2021-05-02 NOTE — Patient Instructions (Signed)
If you are age 37 or older, your body mass index should be between 23-30. Your Body mass index is 25.19 kg/m. If this is out of the aforementioned range listed, please consider follow up with your Primary Care Provider.  If you are age 48 or younger, your body mass index should be between 19-25. Your Body mass index is 25.19 kg/m. If this is out of the aformentioned range listed, please consider follow up with your Primary Care Provider.   __________________________________________________________  The [facility] providers would like to encourage you to use [facility] to communicate with providers for non-urgent requests or questions.  Due to long hold times on the telephone, sending your provider a message by [facility] may be a faster and more efficient way to get a response.  Please allow 48 business hours for a response.  Please remember that this is for non-urgent requests.   We have sent the following medications to your pharmacy for you to pick up at your convenience: Bentyl  You have been scheduled for a colonoscopy. Please follow written instructions given to you at your visit today.  Please pick up your prep supplies at the pharmacy within the next 1-3 days. If you use inhalers (even only as needed), please bring them with you on the day of your procedure.  It was a pleasure to see you today!  Vito Cirigliano, D.O.

## 2021-05-03 ENCOUNTER — Telehealth: Payer: Self-pay | Admitting: Gastroenterology

## 2021-05-03 ENCOUNTER — Encounter: Payer: Self-pay | Admitting: Certified Registered Nurse Anesthetist

## 2021-05-03 NOTE — Telephone Encounter (Signed)
Patient called states the Bentyl medication was sent to [facility] not sure how but please resend to [facility]

## 2021-05-06 ENCOUNTER — Encounter: Payer: Self-pay | Admitting: Gastroenterology

## 2021-05-06 ENCOUNTER — Ambulatory Visit (AMBULATORY_SURGERY_CENTER): Payer: Medicaid Other | Admitting: Gastroenterology

## 2021-05-06 ENCOUNTER — Other Ambulatory Visit: Payer: Self-pay

## 2021-05-06 VITALS — BP 111/86 | HR 63 | Temp 97.5°F | Resp 12 | Ht 61.0 in | Wt 129.0 lb

## 2021-05-06 DIAGNOSIS — K640 First degree hemorrhoids: Secondary | ICD-10-CM

## 2021-05-06 DIAGNOSIS — R194 Change in bowel habit: Secondary | ICD-10-CM

## 2021-05-06 DIAGNOSIS — R935 Abnormal findings on diagnostic imaging of other abdominal regions, including retroperitoneum: Secondary | ICD-10-CM

## 2021-05-06 DIAGNOSIS — R1032 Left lower quadrant pain: Secondary | ICD-10-CM

## 2021-05-06 DIAGNOSIS — D125 Benign neoplasm of sigmoid colon: Secondary | ICD-10-CM | POA: Diagnosis not present

## 2021-05-06 DIAGNOSIS — K52832 Lymphocytic colitis: Secondary | ICD-10-CM | POA: Diagnosis not present

## 2021-05-06 DIAGNOSIS — K921 Melena: Secondary | ICD-10-CM

## 2021-05-06 DIAGNOSIS — D124 Benign neoplasm of descending colon: Secondary | ICD-10-CM | POA: Diagnosis not present

## 2021-05-06 DIAGNOSIS — D122 Benign neoplasm of ascending colon: Secondary | ICD-10-CM | POA: Diagnosis not present

## 2021-05-06 DIAGNOSIS — D123 Benign neoplasm of transverse colon: Secondary | ICD-10-CM

## 2021-05-06 DIAGNOSIS — K529 Noninfective gastroenteritis and colitis, unspecified: Secondary | ICD-10-CM | POA: Diagnosis not present

## 2021-05-06 DIAGNOSIS — K5730 Diverticulosis of large intestine without perforation or abscess without bleeding: Secondary | ICD-10-CM

## 2021-05-06 MED ORDER — SODIUM CHLORIDE 0.9 % IV SOLN
500.0000 mL | INTRAVENOUS | Status: DC
Start: 2021-05-06 — End: 2021-05-06

## 2021-05-06 NOTE — Patient Instructions (Addendum)
Handouts provided on polyps, diverticulosis, hemorrhoids and hemorrhoid banding.   Use fiber, for example Citrucel, Fibercon, Konsyl or Metamucil.   YOU HAD AN ENDOSCOPIC PROCEDURE TODAY AT [facility]:   Refer to the procedure report that was given to you for any specific questions about what was found during the examination.  If the procedure report does not answer your questions, please call your gastroenterologist to clarify.  If you requested that your care partner not be given the details of your procedure findings, then the procedure report has been included in a sealed envelope for you to review at your convenience later.  YOU SHOULD EXPECT: Some feelings of bloating in the abdomen. Passage of more gas than usual.  Walking can help get rid of the air that was put into your GI tract during the procedure and reduce the bloating. If you had a lower endoscopy (such as a colonoscopy or flexible sigmoidoscopy) you may notice spotting of blood in your stool or on the toilet paper. If you underwent a bowel prep for your procedure, you may not have a normal bowel movement for a few days.  Please Note:  You might notice some irritation and congestion in your nose or some drainage.  This is from the oxygen used during your procedure.  There is no need for concern and it should clear up in a day or so.  SYMPTOMS TO REPORT IMMEDIATELY:  Following lower endoscopy (colonoscopy or flexible sigmoidoscopy):  Excessive amounts of blood in the stool  Significant tenderness or worsening of abdominal pains  Swelling of the abdomen that is new, acute  Fever of 100F or higher  For urgent or emergent issues, a gastroenterologist can be reached at any hour by calling [phone]. Do not use MyChart messaging for urgent concerns.    DIET:  We do recommend a small meal at first, but then you may proceed to your regular diet.  Drink plenty of fluids but you should avoid alcoholic beverages for  24 hours.  ACTIVITY:  You should plan to take it easy for the rest of today and you should NOT DRIVE or use heavy machinery until tomorrow (because of the sedation medicines used during the test).    FOLLOW UP: Our staff will call the number listed on your records 48-72 hours following your procedure to check on you and address any questions or concerns that you may have regarding the information given to you following your procedure. If we do not reach you, we will leave a message.  We will attempt to reach you two times.  During this call, we will ask if you have developed any symptoms of COVID 19. If you develop any symptoms (ie: fever, flu-like symptoms, shortness of breath, cough etc.) before then, please call [phone].  If you test positive for Covid 19 in the 2 weeks post procedure, please call and report this information to [place].    If any biopsies were taken you will be contacted by phone or by letter within the next 1-3 weeks.  Please call us at [phone] if you have not heard about the biopsies in 3 weeks.    SIGNATURES/CONFIDENTIALITY: You and/or your care partner have signed paperwork which will be entered into your electronic medical record.  These signatures attest to the fact that that the information above on your After Visit Summary has been reviewed and is understood.  Full responsibility of the confidentiality of this discharge information lies with you and/or  your care-partner.

## 2021-05-06 NOTE — Progress Notes (Signed)
Report given to PACU, vss 

## 2021-05-06 NOTE — Progress Notes (Signed)
Patient states no change since office visit

## 2021-05-06 NOTE — Progress Notes (Signed)
Called to room to assist during endoscopic procedure.  Patient ID and intended procedure confirmed with present staff. Received instructions for my participation in the procedure from the performing physician.  

## 2021-05-06 NOTE — Op Note (Signed)
[facility] Patient Name: Janice Andersen Procedure Date: 05/06/2021 8:27 AM MRN: [id number] Endoscopist: Gerrit Heck , MD Age: 34 Referring MD:  Date of Birth: Jun 29, 1986 Gender: Female Account #: [id number] Procedure:                Colonoscopy Indications:              Abdominal pain in the left lower quadrant,                            Hematochezia, Abnormal CT of the GI tract, Change                            in bowel habits, Mucus-like stools Medicines:                Monitored Anesthesia Care Procedure:                Pre-Anesthesia Assessment:                           - Prior to the procedure, a History and Physical                            was performed, and patient medications and                            allergies were reviewed. The patient's tolerance of                            previous anesthesia was also reviewed. The risks                            and benefits of the procedure and the sedation                            options and risks were discussed with the patient.                            All questions were answered, and informed consent                            was obtained. Prior Anticoagulants: The patient has                            taken no previous anticoagulant or antiplatelet                            agents. ASA Grade Assessment: II - A patient with                            mild systemic disease. After reviewing the risks                            and benefits, the patient was deemed in  satisfactory condition to undergo the procedure.                           After obtaining informed consent, the colonoscope                            was passed under direct vision. Throughout the                            procedure, the patient's blood pressure, pulse, and                            oxygen saturations were monitored continuously. The                            CF [id number] was  introduced through the anus                            and advanced to the the terminal ileum. The                            colonoscopy was performed without difficulty. The                            patient tolerated the procedure well. The quality                            of the bowel preparation was good. The terminal                            ileum, ileocecal valve, appendiceal orifice, and                            rectum were photographed. Scope In: 8:50:57 AM Scope Out: 9:08:13 AM Scope Withdrawal Time: 0 hours 15 minutes 21 seconds  Total Procedure Duration: 0 hours 17 minutes 16 seconds  Findings:                 The perianal and digital rectal examinations were                            normal.                           Three sessile polyps were found in the sigmoid                            colon, descending colon, and transverse colon. The                            polyps were 3 to 5 mm in size. These polyps were                            removed with a cold snare. Resection and retrieval  were complete. Estimated blood loss was minimal.                           A few small-mouthed diverticula were found in the                            sigmoid colon and a single small diverticula in the                            ascending colon. The surrounding mucosa was normal                            appearing and without erythema, edema, erosions,                            ulceration, or luminal narrowing.                           The visualized mucosa was otherwise normal                            appearing throughout the colon. Biopsies for                            histology were taken with a cold forceps from the                            right colon and left colon for evaluation of                            microscopic colitis. Estimated blood loss was                            minimal.                           Non-bleeding internal  hemorrhoids were found during                            retroflexion. The hemorrhoids were small.                           The terminal ileum appeared normal. Complications:            No immediate complications. Estimated Blood Loss:     Estimated blood loss was minimal. Impression:               - Three 3 to 5 mm polyps in the sigmoid colon, in                            the descending colon and in the transverse colon,                            removed with a cold snare. Resected and retrieved.                           -  Diverticulosis in the sigmoid colon and in the                            ascending colon.                           - Normal mucosa in the entire examined colon.                            Biopsied.                           - Non-bleeding internal hemorrhoids.                           - The examined portion of the ileum was normal. Recommendation:           - Patient has a contact number available for                            emergencies. The signs and symptoms of potential                            delayed complications were discussed with the                            patient. Return to normal activities tomorrow.                            Written discharge instructions were provided to the                            patient.                           - Resume previous diet.                           - Continue present medications.                           - Await pathology results.                           - Repeat colonoscopy for surveillance based on                            pathology results.                           - Use fiber, for example Citrucel, Fibercon, Konsyl                            or Metamucil.                           - Return to GI clinic PRN.                           -  Internal hemorrhoids were noted on this study and                            may be amenable to hemorrhoid band ligation. If you                            are  interested in further treatment of these                            hemorrhoids with band ligation, please contact my                            clinic to set up an appointment for evaluation and                            treatment. Gerrit Heck, MD 05/06/2021 9:17:08 AM

## 2021-05-06 NOTE — Telephone Encounter (Signed)
RX for Bentyl was sent to [facility] on 05/02/21. Confirmation received.

## 2021-05-06 NOTE — Progress Notes (Signed)
0900 Jaw thrust performed due to laryngeal spasm, O2 sat down to 88 to 89 %. O2 sat returned to greater 90% on 6 l [place]. vss

## 2021-05-06 NOTE — Progress Notes (Signed)
GASTROENTEROLOGY PROCEDURE H&P NOTE   Primary Care Physician: Patric Dykes, MD    Reason for Procedure:  LLQ pain, hematochezia, change in bowel habits, inflammation on CT scan  Plan:    Colonoscopy  Patient is appropriate for endoscopic procedure(s) in the ambulatory ([facility]) setting.  The nature of the procedure, as well as the risks, benefits, and alternatives were carefully and thoroughly reviewed with the patient. Ample time for discussion and questions allowed. The patient understood, was satisfied, and agreed to proceed.     HPI: Janice Andersen is a 35 y.o. female who presents for colonoscopy for evaluation of LLQ pain, hematochezia, mucus-like stools, and subtle wall thickening/edema of the descending colon on recent CT scan.  Patient was most recently seen in the Gastroenterology Clinic by me on 05/02/2021.  No interval change in medical history since that appointment. Please refer to that note for full details regarding GI history and clinical presentation.   Past Medical History:  Diagnosis Date   Anxiety    Asthma    Depression    Thyroid cancer ([place])     Past Surgical History:  Procedure Laterality Date   LAPAROSCOPIC HYSTERECTOMY     THYROIDECTOMY      Prior to Admission medications   Medication Sig Start Date End Date Taking? Authorizing Provider  levothyroxine (SYNTHROID) 88 MCG tablet Take 88 mcg by mouth daily before breakfast.   Yes [provider]  albuterol (VENTOLIN HFA) 108 (90 Base) MCG/ACT inhaler Inhale 2 puffs into the lungs every 6 (six) hours as needed. 04/02/20   [provider]  dicyclomine (BENTYL) 10 MG capsule Take 1 capsule (10 mg total) by mouth every 6 (six) hours as needed for spasms. Patient not taking: Reported on 05/06/2021 05/02/21   Lavena Bullion, DO    Current Outpatient Medications  Medication Sig Dispense Refill   levothyroxine (SYNTHROID) 88 MCG tablet Take 88 mcg by mouth daily before breakfast.      albuterol (VENTOLIN HFA) 108 (90 Base) MCG/ACT inhaler Inhale 2 puffs into the lungs every 6 (six) hours as needed.     dicyclomine (BENTYL) 10 MG capsule Take 1 capsule (10 mg total) by mouth every 6 (six) hours as needed for spasms. (Patient not taking: Reported on 05/06/2021) 60 capsule 3   Current Facility-Administered Medications  Medication Dose Route Frequency Provider Last Rate Last Admin   0.9 %  sodium chloride infusion  500 mL Intravenous Continuous Ashford Clouse V, DO        Allergies as of 05/06/2021 - Review Complete 05/06/2021  Allergen Reaction Noted   Phenergan [promethazine hcl] Swelling 08/02/2013    Family History  Problem Relation Age of Onset   Colon cancer Neg Hx     Social History   Socioeconomic History   Marital status: Married    Spouse name: Not on file   Number of children: Not on file   Years of education: Not on file   Highest education level: Not on file  Occupational History   Not on file  Tobacco Use   Smoking status: Every Day    Types: Cigarettes   Smokeless tobacco: Never  Vaping Use   Vaping Use: Never used  Substance and Sexual Activity   Alcohol use: Yes   Drug use: Never   Sexual activity: Not on file  Other Topics Concern   Not on file  Social History Narrative   Not on file   Social Determinants of Health  Financial Resource Strain: Not on file  Food Insecurity: Not on file  Transportation Needs: Not on file  Physical Activity: Not on file  Stress: Not on file  Social Connections: Not on file  Intimate Partner Violence: Not on file    Physical Exam: Vital signs in last 24 hours: '@BP'$  120/66   Pulse 66   Temp (!) 97.5 F (36.4 C)   Ht '5\' 1"'$  (1.549 m)   Wt 129 lb (58.5 kg)   SpO2 98%   BMI 24.37 kg/m  GEN: NAD EYE: Sclerae anicteric ENT: MMM CV: Non-tachycardic Pulm: CTA b/l GI: Soft, NT/ND NEURO:  Alert & Oriented x 3   Gerrit Heck, DO [facility]   05/06/2021 8:39 AM

## 2021-05-08 ENCOUNTER — Telehealth: Payer: Self-pay | Admitting: *Deleted

## 2021-05-08 NOTE — Telephone Encounter (Signed)
NO answer for post procedure follow up call. Unable to leave message.

## 2021-05-08 NOTE — Telephone Encounter (Signed)
  Follow up Call-  Call back number 05/06/2021  Post procedure Call Back phone  # [phone]  Permission to leave phone message Yes  Some recent data might be hidden   Voicemail not set up

## 2021-05-13 ENCOUNTER — Telehealth: Payer: Self-pay | Admitting: General Surgery

## 2021-05-13 NOTE — Telephone Encounter (Signed)
Spoke with the patient and she did not understand the microcytic colitis.She claims to have left sided pain,  denies any diarrhea. The patient would like to see Dr Bryan Lemma to discuss her diagnosis. Scheduled the patient to see dr Bryan Lemma 05/28/2021

## 2021-05-13 NOTE — Telephone Encounter (Signed)
-----   Message from [place], DO sent at 05/13/2021 10:16 AM EDT ----- Biopsies from the recent colonoscopy notable for the following:  -One of the polyps removed was a Tubular Adenoma and another was a Sessile Serrated Polyp.  These types of polyps are considered benign, but precancerous.  These removed entirely at the time of colonoscopy. -A third polyp removed was a benign Hyperplastic Polyp.  This type of polyp harbors no malignant potential. -The biopsies from your recent colonoscopy were notable for Microscopic Colitis (Lymphocytic Colitis).  This is a chronic inflammatory condition that is typically due to medications but can also be associated with other autoimmune diseases (i.e. diabetes, thyroid disorders, celiac disease, etc.).  Treatment of this condition should be as follows  -Review for associated medications, most commonly NSAIDs, PPIs, ranitidine, statins, SSRIs. -If currently smoking, strongly recommend smoking cessation -Start with loperamide for mild diarrhea (<3 stools daily).  If nocturnal symptoms are worst, start his nightly. -In patients with >3 stools daily, or no improvement with loperamide, start budesonide 9 mg daily for 6 to 8 weeks then taper to 6 mg x 2 weeks, 3 mg x 2 weeks, discontinue.  If symptoms persist or recur during tapering, continue budesonide 9 mg x 12 weeks then slowly taper.

## 2021-05-20 ENCOUNTER — Telehealth: Payer: Self-pay | Admitting: Gastroenterology

## 2021-05-20 NOTE — Telephone Encounter (Signed)
Patient called stating she was in pain and having bloody stools (bright red blood) and these are different/new symptoms from those she talked with you about before.  Had recent colonoscopy.  She wanted to be seen sooner than scheduled appointment, but there is nothing available currently. Wants to be advised as to what to do between now and scheduled appointment.

## 2021-05-20 NOTE — Telephone Encounter (Signed)
Spoke with patient, she reports that she has been having rectal pain/throbbing since yesterday. Pt reports that yesterday she went to have a bowel movement and only passed gas but when she passed gas it felt like something "burst". Pt feels like it may her hemorrhoids, she reports BRB with wiping. Pt states that she had 3 BM's yesterday and she described as loose stringy stools with mucous. Patient states that the Dicyclomine has helped with the LLQ spasms, she takes it about 3-4 times a day. Pt is scheduled for a follow up with you on 05/28/21 but is in a lot of pain. Pt does not wish to go to the hospital for evaluation. Pt states that she had one small bowel movement today and it was so painful it brought tears to her eyes. Please advise, thanks

## 2021-05-21 NOTE — Telephone Encounter (Signed)
Pt returned call, she has been advised of recommendations. No sooner appt available, pt will keep appt as scheduled. Pt verbalized understanding and had no concerns at the end of the call.

## 2021-05-21 NOTE — Telephone Encounter (Signed)
Based on those symptoms, could be either external hemorrhoid or possibly anal fissure.  Recommend conservative management with sitz bath's, RectiCare, with evaluation in the office as scheduled.  Can see if one of the APP's has an appointment sooner.

## 2021-05-21 NOTE — Telephone Encounter (Signed)
Attempted to reach patient, her vm is not set up. Unable to leave a vm at this time.

## 2021-05-28 ENCOUNTER — Encounter: Payer: Self-pay | Admitting: Gastroenterology

## 2021-05-28 ENCOUNTER — Other Ambulatory Visit: Payer: Self-pay

## 2021-05-28 ENCOUNTER — Ambulatory Visit (INDEPENDENT_AMBULATORY_CARE_PROVIDER_SITE_OTHER): Payer: Medicaid Other | Admitting: Gastroenterology

## 2021-05-28 VITALS — BP 120/78 | HR 70 | Wt 132.2 lb

## 2021-05-28 DIAGNOSIS — K640 First degree hemorrhoids: Secondary | ICD-10-CM

## 2021-05-28 DIAGNOSIS — K52832 Lymphocytic colitis: Secondary | ICD-10-CM | POA: Diagnosis not present

## 2021-05-28 DIAGNOSIS — Z716 Tobacco abuse counseling: Secondary | ICD-10-CM

## 2021-05-28 DIAGNOSIS — K602 Anal fissure, unspecified: Secondary | ICD-10-CM | POA: Diagnosis not present

## 2021-05-28 DIAGNOSIS — Z86010 Personal history of colonic polyps: Secondary | ICD-10-CM | POA: Diagnosis not present

## 2021-05-28 MED ORDER — AMBULATORY NON FORMULARY MEDICATION: 1 refills | Status: AC

## 2021-05-28 NOTE — Progress Notes (Signed)
Chief Complaint:   Lymphocytic Colitis, hematochezia, procedure follow-up  GI History: 35 y.o. female with a history of depression, anxiety, prior papillary thyroid cancer s/p thyroidectomy with postoperative hypothyroidism, hysterectomy, initially seen in the GI clinic on 05/02/2021 for evaluation of hematochezia, abdominal pain, colitis on CT.  Evaluation to date as below:  - ER evaluation on 03/19/2021 with LLQ pain x1 day and 4 episodes of hematochezia and mucus-like BRBPR.  + Urgency.  No prior similar episodes.     - WBC 11.4, otherwise normal CBC     - Normal CMP, lipase, lactate     - CT abdomen/pelvis: Subtle wall thickening/edema adjacent to descending colon, suspicious for ischemia given distribution and clinical history.  Otherwise normal GI tract, liver, pancreas, GB     - Treated with IVF, antiemetics; discharged with Reglan, Norco with GI referral - 05/02/2021: GI evaluation.  Still with LLQ pain, described as spasms/cramping which improves with BM, +urgency.  Hematochezia resolved but still with mucus-like stools.  Prescribed dicyclomine. - 05/06/2021: Colonoscopy: 3 subcentimeter polyps (TA x1, SSP x1, HP x1), sigmoid diverticulosis and 1 ascending colon diverticula, normal mucosa (path: Lymphocytic Colitis).  Small internal hemorrhoids.  Normal TI.  Started on loperamide and recommended smoking cessation  HPI:     Patient is a 35 y.o. female presenting to the Gastroenterology Clinic for follow-up.  Initially seen by me on 05/02/2021.  Expedited colonoscopy notable for Lymphocytic Colitis.  Dicyclomine helps with LLQ spasms, but not much in the way of diarrhea to necessitate taking loperamide.  Continues to have BRB on tissue paper.  Recommended sitz bath, RectiCare, and follow-up appointment.  Today, states main issue is rectal pain and rectal bleeding, described as BRB on tissue paper.  No diarrhea.   No new labs or abdominal imaging since last appointment.    Review of  systems:     No chest pain, no SOB, no fevers, no urinary sx   Past Medical History:  Diagnosis Date   Anxiety    Asthma    Depression    Thyroid cancer ([place])     Patient's surgical history, family medical history, social history, medications and allergies were all reviewed in Epic    Current Outpatient Medications  Medication Sig Dispense Refill   albuterol (VENTOLIN HFA) 108 (90 Base) MCG/ACT inhaler Inhale 2 puffs into the lungs every 6 (six) hours as needed.     dicyclomine (BENTYL) 10 MG capsule Take 1 capsule (10 mg total) by mouth every 6 (six) hours as needed for spasms. 60 capsule 3   levothyroxine (SYNTHROID) 88 MCG tablet Take 88 mcg by mouth daily before breakfast.     No current facility-administered medications for this visit.    Physical Exam:     BP 120/78   Pulse 70   Wt 132 lb 4 oz (60 kg)   SpO2 98%   BMI 24.99 kg/m   GENERAL:  Pleasant female in NAD PSYCH: : Cooperative, normal affect NEURO: Alert and oriented x 3, no focal neurologic deficits Rectal exam: Sensation intact and preserved anal wink. No external anal fissures, external hemorrhoids or skin tags. Normal sphincter tone.  Palpable internal fissure on right side.  Anoscopy with grade 1 hemorrhoids in LL and RP positions with internal fissure on right side.   (Chaperone: Renee Rival, CMA.     IMPRESSION and PLAN:    1) Anal Fissure: Physical exam notable for right-sided internal anal fissure. Will treat as below:  - Start topical  NTG 0.125%, apply a small, pea-sized amount to the affected area BID for 6-8 weeks.  - We discussed the ADR of headache, and if patient does experience, to call me and will change and make pharmacy request to compound topical CCB (topical nifedipine 0.2-0.3% applied 2-4 times daily; unfortunately, the CCB also carries an ADR of headache in 5-12%). Additionally, cautioned to avoid strenuous activity within 30 minutes of application  - Continue fiber supplement for a  goal of regular, soft, stools without straining to have a bowel movement. If unable to achieve with fiber alone, or if sxs are exacerbated with fiber, can consider starting oral laxative agent (ie, Miralax) to improve stool consistency and aid in fissure healing  - Sitz bath with warm water for 10-15 minutes 2-3 times daily; ensure to dry area afterwards - To f/u in the clinic in 3 months or sooner prn  2) Internal hemorrhoids - Exam also notable for internal hemorrhoids.  If fissure heals appropriately but ongoing hemorrhoidal symptoms, discussed plan for hemorrhoid banding at that time  3) History of colon polyps - Colonoscopy in 04/2021 with tubular adenoma x1 and sessile serrated polyp x1. - Repeat colonoscopy in 2027 for ongoing polyp surveillance  4) Lymphocytic colitis - Colonoscopy biopsies with lymphocytic colitis.  No c/o diarrhea so no plan for loperamide, budesonide, etc. - Recommended smoking cessation  5) LLQ pain - Resolved  6) Tobacco use disorder - As above, again counseled patient on smoking cessation  RTC in 3 months or sooner as needed      Lavena Bullion ,DO, FACG 05/28/2021, 9:20 AM

## 2021-05-28 NOTE — Patient Instructions (Addendum)
If you are age 37 or older, your body mass index should be between 23-30. Your Body mass index is 24.99 kg/m. If this is out of the aforementioned range listed, please consider follow up with your Primary Care Provider.  If you are age 77 or younger, your body mass index should be between 19-25. Your Body mass index is 24.99 kg/m. If this is out of the aformentioned range listed, please consider follow up with your Primary Care Provider.   __________________________________________________________  The [facility] providers would like to encourage you to use [facility] to communicate with providers for non-urgent requests or questions.  Due to long hold times on the telephone, sending your provider a message by [facility] may be a faster and more efficient way to get a response.  Please allow 48 business hours for a response.  Please remember that this is for non-urgent requests.  ________________________________________________________  Please use sitz baths and increase your water intake to 64 ounces daily.  [facility]'s information is below: Address: [address] [place], [place] [address]  [phone]  *Please DO NOT go directly from our office to pick up this medication! Give the pharmacy 1 day to process the prescription as this is compounded and takes time to make.  ____________________________________________________ We would like you to return to the clinic in 3 months, please call us in December 2022 to book this appointment.   Thank you for choosing me and [facility].  Vito Cirigliano, D.O.

## 2022-08-05 IMAGING — CT CT ABD-PELV W/ CM
2 of 4 series · 16 of 46 positions shown, 18 images · IV contrast (omnipaque)
Comparison: 09/03/2014 from [REDACTED]

CLINICAL DATA: Left lower abdominal pain with hematochezia.
Diverticulitis suspected.

EXAM:
CT ABDOMEN AND PELVIS WITH CONTRAST
TECHNIQUE: Multidetector CT imaging of the abdomen and pelvis was performed
using the standard protocol following bolus administration of
intravenous contrast.
CONTRAST:  100mL OMNIPAQUE IOHEXOL 300 MG/ML  SOLN

[Series 3: a/p w/ 5mm · axial · 0.72mm/px · z∈[+829,+1229]mm · 13 of 88 slices shown, 15 images]
[im 4/88  soft-tissue]
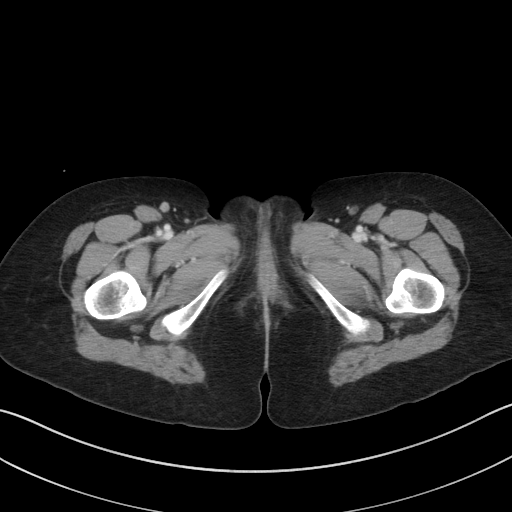
[im 4/88  bone]
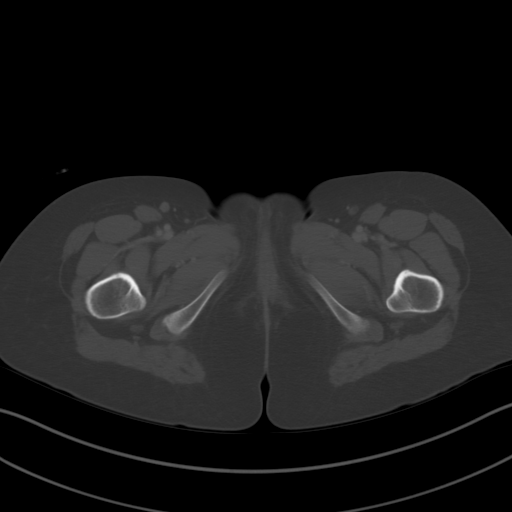
[im 11/88  soft-tissue]
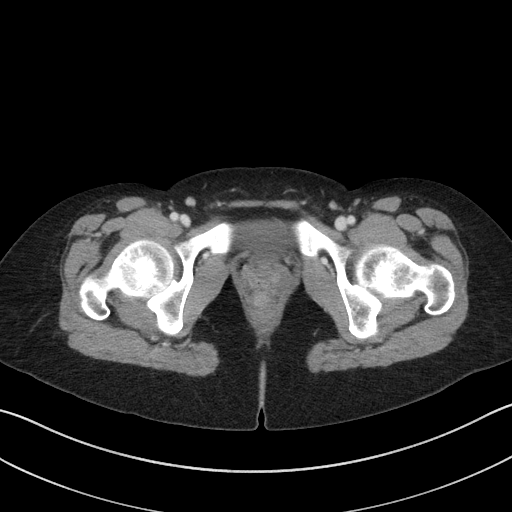
[im 18/88  soft-tissue]
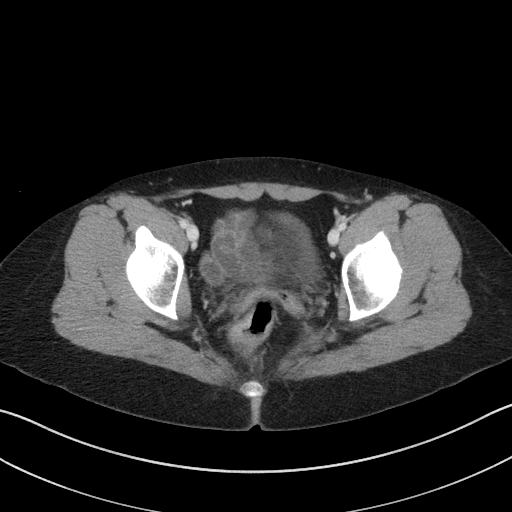
[im 25/88  soft-tissue]
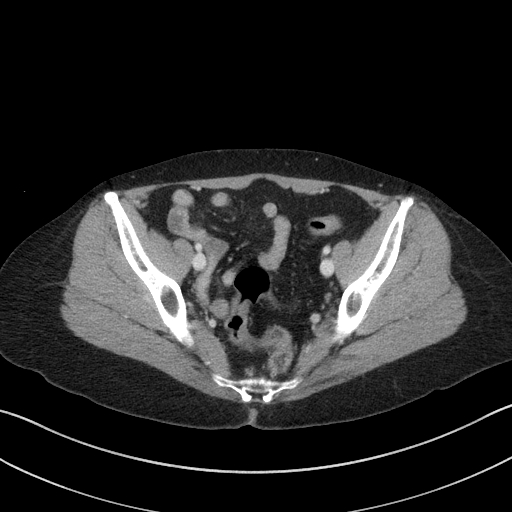
[im 32/88  soft-tissue]
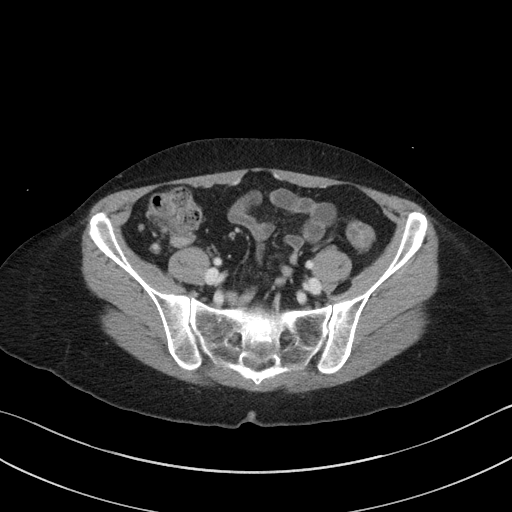
[im 39/88  soft-tissue]
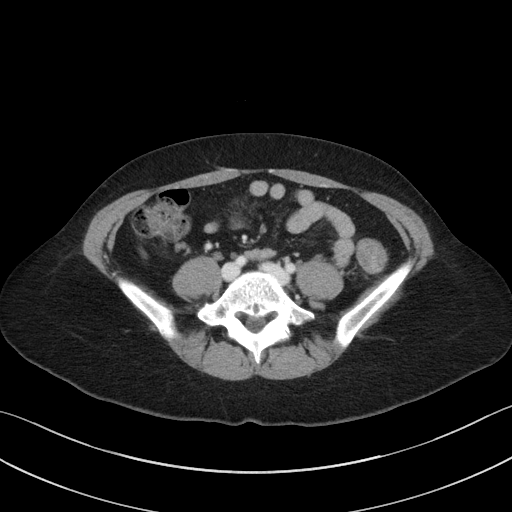
[im 46/88  soft-tissue]
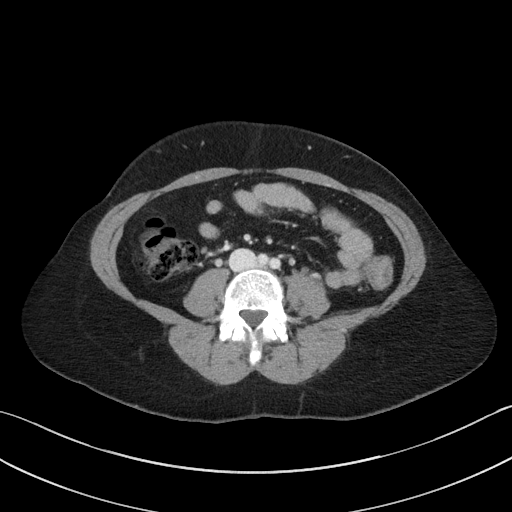
[im 49/88  soft-tissue]
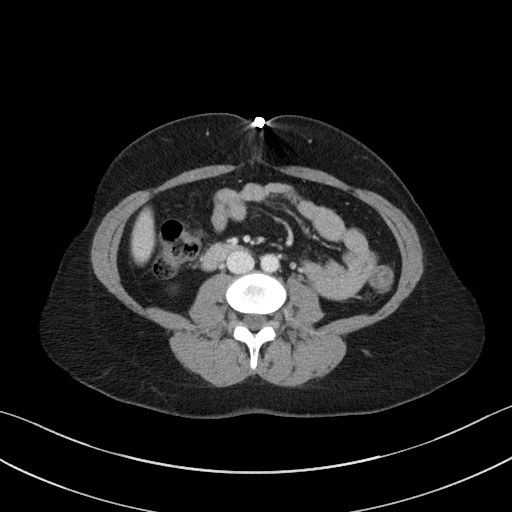
[im 56/88  soft-tissue]
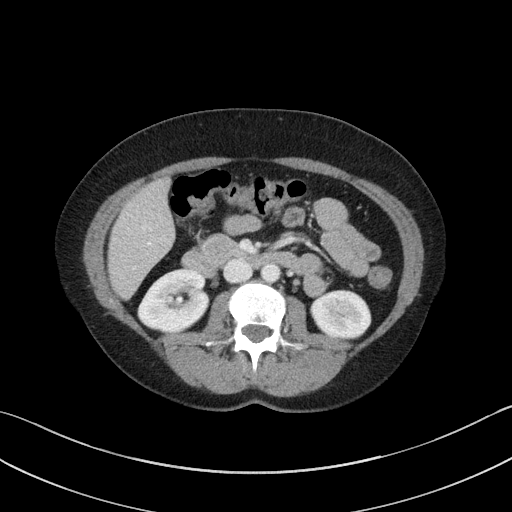
[im 56/88  bone]
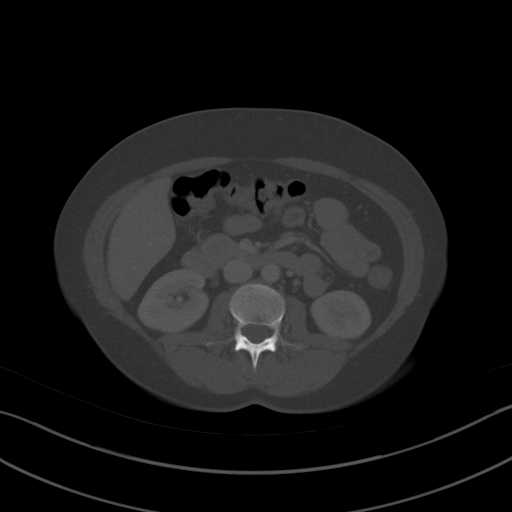
[im 63/88  soft-tissue]
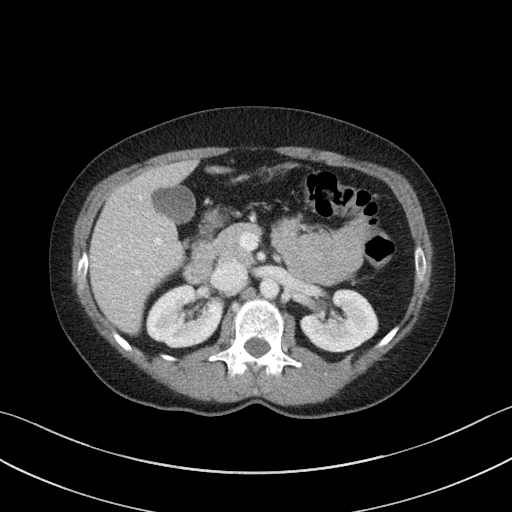
[im 70/88  soft-tissue]
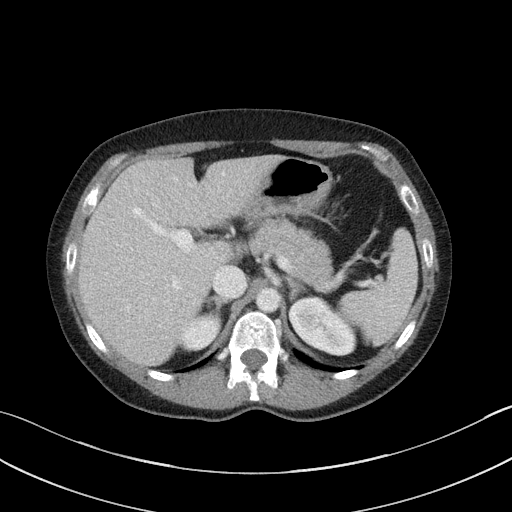
[im 77/88  soft-tissue]
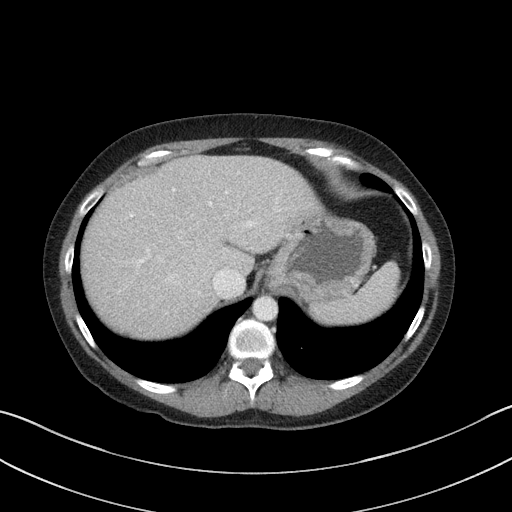
[im 84/88  soft-tissue]
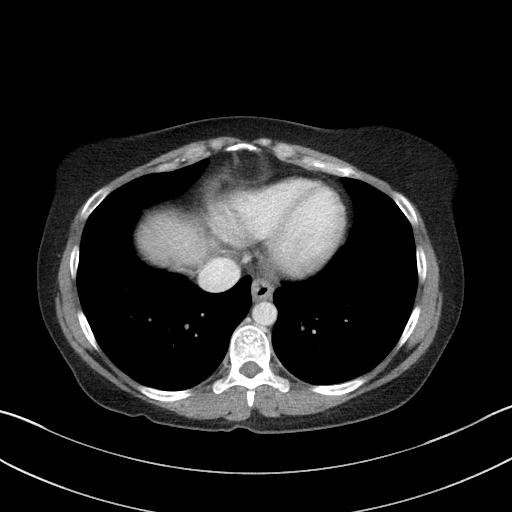

[Series 6: a/p w/ cor · coronal · 0.76mm/px · 3 of 108 slices shown]
[im 36/108  soft-tissue]
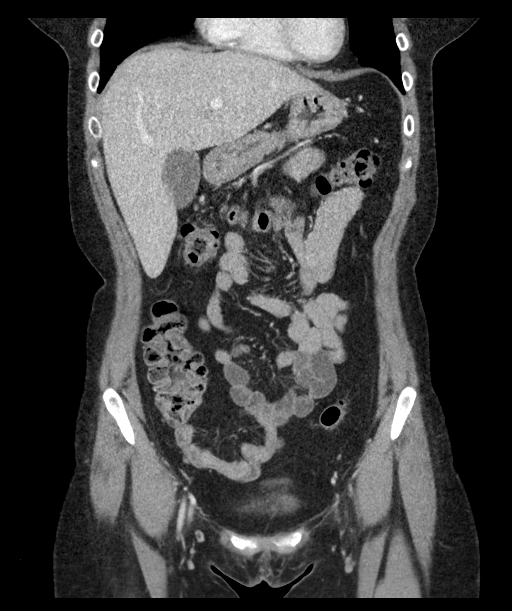
[im 48/108  soft-tissue]
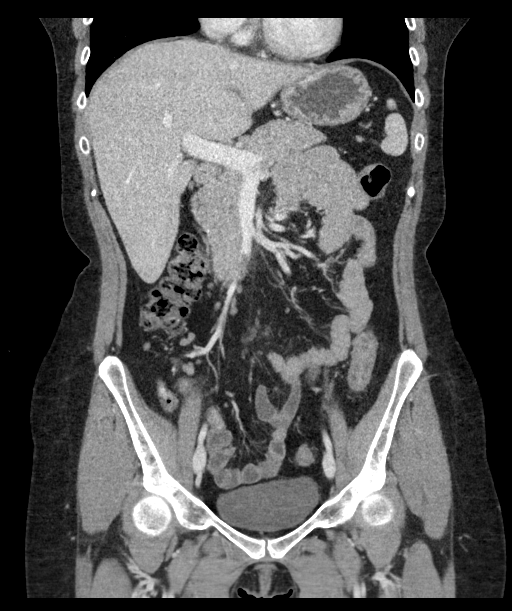
[im 60/108  soft-tissue]
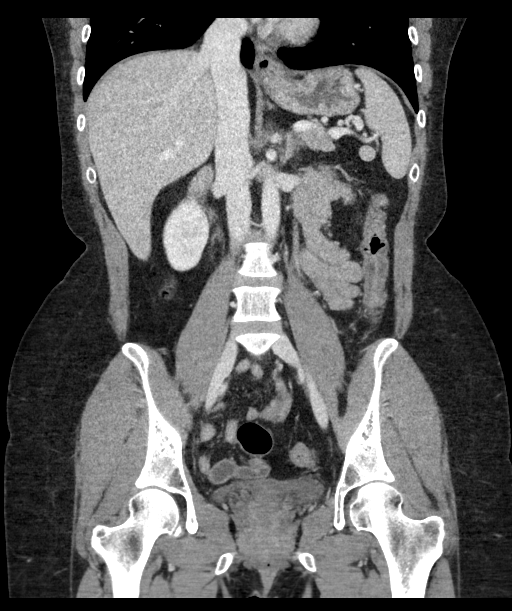

[16 of 46 positions shown; findings below may reference images not displayed]

FINDINGS: Lower chest: Clear lung bases. Normal heart size without pericardial
or pleural effusion.

Hepatobiliary: Normal liver. Normal gallbladder, without biliary
ductal dilatation.

Pancreas: Normal, without mass or ductal dilatation.

Spleen: Normal in size, without focal abnormality.

Adrenals/Urinary Tract: Normal adrenal glands. Normal kidneys,
without hydronephrosis. Normal urinary bladder.

Stomach/Bowel: Normal stomach, without wall thickening. Subtle wall
thickening of and edema adjacent the descending colon, including on
images 46 and 54 of series 3. No significant diverticula in this
area.

Normal terminal ileum and appendix.  Normal small bowel.

Vascular/Lymphatic: Patent mesenteric vessels. Normal abdominal
aortic caliber. No abdominopelvic adenopathy.

Reproductive: Hysterectomy.  No adnexal mass.

Other: No significant free fluid.  No free intraperitoneal air.

Musculoskeletal: No acute osseous abnormality.
IMPRESSION: Subtle descending colitis which, given the distribution and clinical
history, is suspicious for ischemia.

## 2023-10-03 ENCOUNTER — Ambulatory Visit (HOSPITAL_BASED_OUTPATIENT_CLINIC_OR_DEPARTMENT_OTHER)
Admission: EM | Admit: 2023-10-03 | Discharge: 2023-10-03 | Disposition: A | Discharge status: Home / Self Care | Payer: Medicaid Other | Attending: Physician Assistant | Admitting: Physician Assistant

## 2023-10-03 ENCOUNTER — Encounter (HOSPITAL_BASED_OUTPATIENT_CLINIC_OR_DEPARTMENT_OTHER): Payer: Self-pay

## 2023-10-03 DIAGNOSIS — R509 Fever, unspecified: Secondary | ICD-10-CM

## 2023-10-03 DIAGNOSIS — J101 Influenza due to other identified influenza virus with other respiratory manifestations: Secondary | ICD-10-CM

## 2023-10-03 DIAGNOSIS — R051 Acute cough: Secondary | ICD-10-CM

## 2023-10-03 LAB — POC COVID19/FLU A&B COMBO
Covid Antigen, POC: NEGATIVE
Influenza A Antigen, POC: POSITIVE — AB
Influenza B Antigen, POC: NEGATIVE

## 2023-10-03 MED ORDER — OSELTAMIVIR PHOSPHATE 75 MG PO CAPS: 75.0000 mg | ORAL_CAPSULE | Freq: Two times a day (BID) | ORAL | 0 refills | Status: AC

## 2023-10-03 MED ORDER — BENZONATATE 100 MG PO CAPS: 100.0000 mg | ORAL_CAPSULE | Freq: Three times a day (TID) | ORAL | 0 refills | Status: AC

## 2023-10-03 MED ORDER — ONDANSETRON 4 MG PO TBDP: 4.0000 mg | ORAL_TABLET | Freq: Three times a day (TID) | ORAL | 0 refills | Status: AC | PRN

## 2023-10-03 NOTE — ED Provider Notes (Signed)
 [facility]    CSN: [id number] Arrival date & time: 10/03/23  1140      History   Chief Complaint Chief Complaint  Patient presents with   Cough   Sore Throat   Headache   Generalized Body Aches    HPI Janice Andersen is a 38 y.o. female.   Patient presents today with a 24-hour history of URI symptoms including sore throat, headache, congestion, body aches, fever.  Denies any chest pain, shortness of breath, nausea/vomiting interfering with oral intake.  She reports her son and husband have tested positive for influenza.  She is very anxious to know what is going on because her mother is currently in hospice care with her and she does not want to potentially get her mother sick.  She has not had influenza vaccine.  She has had COVID several years ago.  Denies any recent antibiotics or steroids.  She has been taking Tylenol with her last dose earlier this morning.  She does have a history of asthma and has her albuterol inhaler available.  She has not required use of this since her symptoms began.  Denies any recent hospitalization related to asthma.    Past Medical History:  Diagnosis Date   Anxiety    Asthma    Depression    Thyroid cancer (HCC)     There are no active problems to display for this patient.   Past Surgical History:  Procedure Laterality Date   LAPAROSCOPIC HYSTERECTOMY     THYROIDECTOMY      OB History   No obstetric history on file.      Home Medications    Prior to Admission medications   Medication Sig Start Date End Date Taking? Authorizing Provider  benzonatate (TESSALON) 100 MG capsule Take 1 capsule (100 mg total) by mouth every 8 (eight) hours. 10/03/23  Yes Terricka Onofrio K, PA-C  ondansetron (ZOFRAN-ODT) 4 MG disintegrating tablet Take 1 tablet (4 mg total) by mouth every 8 (eight) hours as needed for nausea or vomiting. 10/03/23  Yes Haizley Cannella K, PA-C  oseltamivir (TAMIFLU) 75 MG capsule Take 1 capsule (75 mg total) by  mouth every 12 (twelve) hours. 10/03/23  Yes Lovena Kluck K, PA-C  albuterol (VENTOLIN HFA) 108 (90 Base) MCG/ACT inhaler Inhale 2 puffs into the lungs every 6 (six) hours as needed. 04/02/20   [provider]  AMBULATORY NON FORMULARY MEDICATION Nitroglycerin ointment 0.125% Apply a pea sized amount rectally twice daily for 8 weeks 05/28/21   Cirigliano, Vito V, DO  dicyclomine (BENTYL) 10 MG capsule Take 1 capsule (10 mg total) by mouth every 6 (six) hours as needed for spasms. 05/02/21   Cirigliano, Vito V, DO  levothyroxine (SYNTHROID) 88 MCG tablet Take 88 mcg by mouth daily before breakfast.    [provider]    Family History Family History  Problem Relation Age of Onset   Throat cancer Mother    Ovarian cancer Maternal Grandmother    Bladder Cancer Maternal Grandfather    Colon cancer Neg Hx    Rectal cancer Neg Hx     Social History Social History   Tobacco Use   Smoking status: Every Day    Types: Cigarettes   Smokeless tobacco: Never  Vaping Use   Vaping status: Never Used  Substance Use Topics   Alcohol use: Yes   Drug use: Never     Allergies   Phenergan [promethazine hcl]   Review of Systems Review of  Systems  Constitutional:  Positive for activity change, fatigue and fever. Negative for appetite change.  HENT:  Positive for congestion and sore throat. Negative for sinus pressure and sneezing.   Respiratory:  Positive for cough. Negative for shortness of breath.   Cardiovascular:  Negative for chest pain.  Gastrointestinal:  Negative for abdominal pain, diarrhea, nausea and vomiting.  Musculoskeletal:  Positive for arthralgias and myalgias.  Neurological:  Negative for dizziness, light-headedness and headaches.     Physical Exam Triage Vital Signs ED Triage Vitals  Encounter Vitals Group     BP 10/03/23 1203 124/73     Systolic BP Percentile --      Diastolic BP Percentile --      Pulse Rate 10/03/23 1203 95     Resp 10/03/23 1203  20     Temp 10/03/23 1203 98.4 F (36.9 C)     Temp Source 10/03/23 1203 Oral     SpO2 10/03/23 1203 93 %     Weight --      Height --      Head Circumference --      Peak Flow --      Pain Score 10/03/23 1206 9     Pain Loc --      Pain Education --      Exclude from Growth Chart --    No data found.  Updated Vital Signs BP 124/73 (BP Location: Right Arm)   Pulse 93   Temp 98.4 F (36.9 C) (Oral)   Resp 20   SpO2 96%   Visual Acuity Right Eye Distance:   Left Eye Distance:   Bilateral Distance:    Right Eye Near:   Left Eye Near:    Bilateral Near:     Physical Exam Vitals reviewed.  Constitutional:      General: She is awake. She is not in acute distress.    Appearance: Normal appearance. She is well-developed. She is not ill-appearing.     Comments: Very pleasant female appears stated age in no acute distress sitting comfortably in exam room  HENT:     Head: Normocephalic and atraumatic.     Right Ear: Tympanic membrane, ear canal and external ear normal. Tympanic membrane is not erythematous or bulging.     Left Ear: Tympanic membrane, ear canal and external ear normal. Tympanic membrane is not erythematous or bulging.     Nose:     Right Sinus: No maxillary sinus tenderness or frontal sinus tenderness.     Left Sinus: No maxillary sinus tenderness or frontal sinus tenderness.     Mouth/Throat:     Pharynx: Uvula midline. No oropharyngeal exudate or posterior oropharyngeal erythema.  Cardiovascular:     Rate and Rhythm: Normal rate and regular rhythm.     Heart sounds: Normal heart sounds, S1 normal and S2 normal. No murmur heard. Pulmonary:     Effort: Pulmonary effort is normal.     Breath sounds: Normal breath sounds. No wheezing, rhonchi or rales.     Comments: Clear to auscultation bilaterally Psychiatric:        Behavior: Behavior is cooperative.      UC Treatments / Results  Labs (all labs ordered are listed, but only abnormal results are  displayed) Labs Reviewed  POC COVID19/FLU A&B COMBO - Abnormal; Notable for the following components:      Result Value   Influenza A Antigen, POC Positive (*)    All other components within normal limits  EKG   Radiology No results found.  Procedures Procedures (including critical care time)  Medications Ordered in UC Medications - No data to display  Initial Impression / Assessment and Plan / UC Course  I have reviewed the triage vital signs and the nursing notes.  Pertinent labs & imaging results that were available during my care of the patient were reviewed by me and considered in my medical decision making (see chart for details).     Patient is well-appearing, afebrile, nontoxic, nontachycardic.  No evidence of acute infection on physical exam that would warrant initiation of antibiotics.  She tested positive for influenza.  She is within 24 hours of symptom onset so we will start Tamiflu twice daily for 5 days particularly as she is anxious to get better in order to spend time with her mother in hospice care.  She was concerned about having GI symptoms from the Tamiflu so Zofran was sent to pharmacy per her request.  We discussed that she is considered contagious until she has been fever free without antipyretics for 24 hours but to discuss appropriate visitation with hospice.  She can use over-the-counter medications for symptom management including Tylenol and ibuprofen.  She was given Tessalon for cough.  Recommended that she rest and drink plenty of fluid.  If her symptoms or not improving within a few days or if she has any worsening symptoms including high fever, worsening cough, shortness of breath, chest pain, nausea/vomiting she needs to be seen immediately.  Strict return precautions given.  Excuse note provided.  Final Clinical Impressions(s) / UC Diagnoses   Final diagnoses:  Influenza A  Fever, unspecified  Acute cough     Discharge Instructions       You tested positive for influenza A.  Start Tamiflu twice daily for 5 days.  If you develop any stomach upset or abnormal behaviors you need to stop the medication to be seen immediately.  Use Tessalon for cough.  Alternate Tylenol and ibuprofen for fever.  Make sure you rest and drink plenty fluid.  If your symptoms are not improving within a few days or if anything worsens you need to be seen immediately.     ED Prescriptions     Medication Sig Dispense Auth. Provider   benzonatate (TESSALON) 100 MG capsule Take 1 capsule (100 mg total) by mouth every 8 (eight) hours. 21 capsule Reveca Desmarais K, PA-C   oseltamivir (TAMIFLU) 75 MG capsule Take 1 capsule (75 mg total) by mouth every 12 (twelve) hours. 10 capsule Marcellino Fidalgo K, PA-C   ondansetron (ZOFRAN-ODT) 4 MG disintegrating tablet Take 1 tablet (4 mg total) by mouth every 8 (eight) hours as needed for nausea or vomiting. 20 tablet Reshma Hoey, Noberto Retort, PA-C      PDMP not reviewed this encounter.   Jeani Hawking, PA-C 10/03/23 1240

## 2023-10-03 NOTE — ED Triage Notes (Signed)
 Sore throat yesterday. Woke up with headache, chest congestion, head congestion, cough, body aches.

## 2023-10-03 NOTE — Discharge Instructions (Signed)
 You tested positive for influenza A.  Start Tamiflu twice daily for 5 days.  If you develop any stomach upset or abnormal behaviors you need to stop the medication to be seen immediately.  Use Tessalon for cough.  Alternate Tylenol and ibuprofen for fever.  Make sure you rest and drink plenty fluid.  If your symptoms are not improving within a few days or if anything worsens you need to be seen immediately.

## 2024-08-13 ENCOUNTER — Ambulatory Visit (HOSPITAL_BASED_OUTPATIENT_CLINIC_OR_DEPARTMENT_OTHER)
